# Patient Record
Sex: Female | Born: 1985 | Race: White | Hispanic: No | Smoking: Never smoker
Health system: Southern US, Community
[De-identification: ages and names within clinical notes are randomized; demographics above are authoritative.]

## PROBLEM LIST (undated history)

## (undated) DIAGNOSIS — R87629 Unspecified abnormal cytological findings in specimens from vagina: Secondary | ICD-10-CM

## (undated) DIAGNOSIS — K859 Acute pancreatitis without necrosis or infection, unspecified: Secondary | ICD-10-CM

## (undated) DIAGNOSIS — N643 Galactorrhea not associated with childbirth: Secondary | ICD-10-CM

## (undated) DIAGNOSIS — B029 Zoster without complications: Secondary | ICD-10-CM

## (undated) DIAGNOSIS — F32A Depression, unspecified: Secondary | ICD-10-CM

## (undated) DIAGNOSIS — F329 Major depressive disorder, single episode, unspecified: Secondary | ICD-10-CM

## (undated) DIAGNOSIS — F419 Anxiety disorder, unspecified: Secondary | ICD-10-CM

## (undated) DIAGNOSIS — O139 Gestational [pregnancy-induced] hypertension without significant proteinuria, unspecified trimester: Secondary | ICD-10-CM

## (undated) HISTORY — DX: Zoster without complications: B02.9

## (undated) HISTORY — DX: Gestational (pregnancy-induced) hypertension without significant proteinuria, unspecified trimester: O13.9

## (undated) HISTORY — DX: Depression, unspecified: F32.A

## (undated) HISTORY — DX: Galactorrhea not associated with childbirth: N64.3

## (undated) HISTORY — DX: Acute pancreatitis without necrosis or infection, unspecified: K85.90

## (undated) HISTORY — DX: Unspecified abnormal cytological findings in specimens from vagina: R87.629

## (undated) HISTORY — PX: LEEP: SHX91

## (undated) HISTORY — DX: Anxiety disorder, unspecified: F41.9

---
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
## 2009-06-23 ENCOUNTER — Inpatient Hospital Stay (HOSPITAL_COMMUNITY): Admission: AD | Admit: 2009-06-23 | Discharge: 2009-06-23 | Payer: Self-pay | Admitting: Obstetrics & Gynecology

## 2009-07-02 ENCOUNTER — Ambulatory Visit: Payer: Self-pay | Admitting: Obstetrics & Gynecology

## 2009-07-07 ENCOUNTER — Other Ambulatory Visit: Admission: RE | Admit: 2009-07-07 | Discharge: 2009-07-07 | Payer: Self-pay | Admitting: Obstetrics and Gynecology

## 2009-07-07 ENCOUNTER — Ambulatory Visit: Payer: Self-pay | Admitting: Obstetrics and Gynecology

## 2009-07-07 LAB — CONVERTED CEMR LAB
Basophils Absolute: 0 10*3/uL (ref 0.0–0.1)
Basophils Relative: 0 % (ref 0–1)
Eosinophils Absolute: 0.1 10*3/uL (ref 0.0–0.7)
Eosinophils Relative: 1 % (ref 0–5)
HCT: 39 % (ref 36.0–46.0)
Hepatitis B Surface Ag: NEGATIVE
Lymphocytes Relative: 21 % (ref 12–46)
Monocytes Absolute: 0.9 10*3/uL (ref 0.1–1.0)
Platelets: 354 10*3/uL (ref 150–400)
Rh Type: POSITIVE

## 2009-08-04 ENCOUNTER — Ambulatory Visit: Payer: Self-pay | Admitting: Obstetrics & Gynecology

## 2009-09-01 ENCOUNTER — Ambulatory Visit: Payer: Self-pay | Admitting: Family Medicine

## 2009-09-15 ENCOUNTER — Ambulatory Visit (HOSPITAL_COMMUNITY): Admission: RE | Admit: 2009-09-15 | Discharge: 2009-09-15 | Payer: Self-pay | Admitting: Obstetrics & Gynecology

## 2009-09-29 ENCOUNTER — Ambulatory Visit: Payer: Self-pay | Admitting: Obstetrics and Gynecology

## 2009-10-27 ENCOUNTER — Ambulatory Visit: Payer: Self-pay | Admitting: Obstetrics & Gynecology

## 2009-11-17 ENCOUNTER — Ambulatory Visit: Payer: Self-pay | Admitting: Obstetrics & Gynecology

## 2009-11-17 ENCOUNTER — Encounter: Payer: Self-pay | Admitting: Obstetrics and Gynecology

## 2009-11-17 LAB — CONVERTED CEMR LAB
HCT: 37.4 % (ref 36.0–46.0)
Hemoglobin: 12.4 g/dL (ref 12.0–15.0)
MCHC: 33.2 g/dL (ref 30.0–36.0)
MCV: 95.9 fL (ref 78.0–100.0)
RBC: 3.9 M/uL (ref 3.87–5.11)
RDW: 12.6 % (ref 11.5–15.5)
WBC: 12.8 10*3/uL — ABNORMAL HIGH (ref 4.0–10.5)

## 2009-12-08 ENCOUNTER — Ambulatory Visit: Payer: Self-pay | Admitting: Obstetrics and Gynecology

## 2009-12-29 ENCOUNTER — Ambulatory Visit: Payer: Self-pay | Admitting: Obstetrics & Gynecology

## 2010-01-12 ENCOUNTER — Ambulatory Visit: Payer: Self-pay | Admitting: Obstetrics and Gynecology

## 2010-01-19 ENCOUNTER — Ambulatory Visit: Payer: Self-pay | Admitting: Obstetrics and Gynecology

## 2010-01-19 LAB — CONVERTED CEMR LAB: GC Probe Amp, Genital: NEGATIVE

## 2010-01-20 ENCOUNTER — Encounter: Payer: Self-pay | Admitting: Obstetrics and Gynecology

## 2010-01-26 ENCOUNTER — Ambulatory Visit: Payer: Self-pay | Admitting: Obstetrics and Gynecology

## 2010-02-02 ENCOUNTER — Inpatient Hospital Stay (HOSPITAL_COMMUNITY)
Admission: AD | Admit: 2010-02-02 | Discharge: 2010-02-04 | Payer: Self-pay | Source: Home / Self Care | Attending: Obstetrics & Gynecology | Admitting: Obstetrics & Gynecology

## 2010-02-03 ENCOUNTER — Ambulatory Visit: Payer: Self-pay | Admitting: Family Medicine

## 2010-03-17 ENCOUNTER — Ambulatory Visit
Admission: RE | Admit: 2010-03-17 | Discharge: 2010-03-17 | Payer: Self-pay | Source: Home / Self Care | Attending: Obstetrics & Gynecology | Admitting: Obstetrics & Gynecology

## 2010-03-18 ENCOUNTER — Ambulatory Visit: Payer: Medicaid Other | Admitting: Obstetrics & Gynecology

## 2010-03-18 DIAGNOSIS — Z30430 Encounter for insertion of intrauterine contraceptive device: Secondary | ICD-10-CM

## 2010-03-18 NOTE — Assessment & Plan Note (Signed)
NAMEBURMA, KETCHER              ACCOUNT NO.:  [id number]  MEDICAL RECORD NO.:  [id number]          PATIENT TYPE:  POB  LOCATION:  [facility] at [facility]         FACILITY:  [facility]  PHYSICIAN:  Allie Bossier, MD        DATE OF BIRTH:  1985/05/05  DATE OF SERVICE:  03/17/2010                                 CLINIC NOTE  Cheryel is a 25 year old married white, G1, now P1, who is postpartum 6 weeks status post NSVD of her son named Roscoe.  She says that she did not have any tears with the delivery and this is confirmed with her delivery record.  She had attempted to breastfeed, but said her "milk never came in," so now she is bottle feeding.  Her breasts are now decreasing in size.  Her baby is growing well.  She denies any baby blues.  She has not had intercourse yet.  After discussion today of birth control options, she would like to have a Mirena placed and this will be scheduled for tomorrow.  She plans to go back to work in 2 weeks (she is a Industrial/product designer). She reports normal bowel and bladder function.  On exam, her perineum has no lesions or tears.  Her bimanual exam reveals a normal size and shape, involuted, retroverted uterus that is nontender.  She also has no adnexal masses or tenderness.  ASSESSMENT AND PLAN:  Postpartum, doing very well.  She will return tomorrow for her IUD placement (Mirena).     Allie Bossier, MD    MCD/MEDQ  D:  03/17/2010  T:  03/18/2010  Job:  [id number]

## 2010-04-13 ENCOUNTER — Ambulatory Visit (INDEPENDENT_AMBULATORY_CARE_PROVIDER_SITE_OTHER): Payer: Medicaid Other | Admitting: Obstetrics & Gynecology

## 2010-04-13 DIAGNOSIS — Z30431 Encounter for routine checking of intrauterine contraceptive device: Secondary | ICD-10-CM

## 2010-04-27 LAB — CBC
Hemoglobin: 12.5 g/dL (ref 12.0–15.0)
MCHC: 34.2 g/dL (ref 30.0–36.0)
WBC: 14.5 10*3/uL — ABNORMAL HIGH (ref 4.0–10.5)

## 2010-04-28 ENCOUNTER — Emergency Department: Payer: Self-pay | Admitting: Unknown Physician Specialty

## 2010-05-04 ENCOUNTER — Ambulatory Visit (INDEPENDENT_AMBULATORY_CARE_PROVIDER_SITE_OTHER): Payer: Medicaid Other | Admitting: Obstetrics & Gynecology

## 2010-05-04 DIAGNOSIS — N949 Unspecified condition associated with female genital organs and menstrual cycle: Secondary | ICD-10-CM

## 2010-05-05 LAB — CBC
HCT: 38.8 % (ref 36.0–46.0)
Hemoglobin: 13.4 g/dL (ref 12.0–15.0)
MCHC: 34.7 g/dL (ref 30.0–36.0)
MCV: 96.5 fL (ref 78.0–100.0)
RBC: 4.01 MIL/uL (ref 3.87–5.11)

## 2010-05-05 LAB — URINALYSIS, ROUTINE W REFLEX MICROSCOPIC
Glucose, UA: NEGATIVE mg/dL
Ketones, ur: NEGATIVE mg/dL
Nitrite: NEGATIVE
Protein, ur: NEGATIVE mg/dL
Urobilinogen, UA: 0.2 mg/dL (ref 0.0–1.0)

## 2010-05-05 LAB — GC/CHLAMYDIA PROBE AMP, GENITAL
Chlamydia, DNA Probe: NEGATIVE
GC Probe Amp, Genital: NEGATIVE

## 2010-05-05 LAB — ABO/RH: ABO/RH(D): B POS

## 2010-05-05 LAB — WET PREP, GENITAL: Yeast Wet Prep HPF POC: NONE SEEN

## 2010-05-05 LAB — POCT PREGNANCY, URINE: Preg Test, Ur: POSITIVE

## 2010-05-08 NOTE — Assessment & Plan Note (Signed)
NAMEEVERLIE, EBLE NO.:  [id number]  MEDICAL RECORD NO.:  [id number]           PATIENT TYPE:  LOCATION:  [facility] at [facility]           FACILITY:  PHYSICIAN:  Scheryl Darter, MD       DATE OF BIRTH:  02-20-1985  DATE OF SERVICE:                                 CLINIC NOTE  Patient comes today for Mirena IUD insertion.  She has reviewed the literature.  She understands the nature of the birth control method and the procedure for insertion.  The risks of bleeding, infection, uterine perforation and pain were discussed.  Questions were answered.  She signed consent for insertion.  PHYSICAL EXAMINATION:  GENERAL:  The patient in no acute distress. VITAL SIGNS:  Weight is 140 pounds, blood pressure 111/65, pulse 91. PELVIC:  External genitalia, vagina and cervix appeared normal.  Cervix was prepped with Betadine, this also was grasped with single- tooth tenaculum.  Uterus sounded to 9 cm.  The Mirena IUD was placed in usual fashion with no problems.  String was trimmed to about 3 cm.  All instruments removed.  The patient tolerated this well.  She was given instructions to report excessive bleeding, pain, abnormal discharge, or fever.  Recommend she return in 4 weeks to check placement of the IUD.     Scheryl Darter, MD    JA/MEDQ  D:  03/18/2010  T:  03/19/2010  Job:  [id number]

## 2010-05-08 NOTE — Assessment & Plan Note (Unsigned)
Maria Pugh, COMMON NO.:  [id number]  MEDICAL RECORD NO.:  [id number]           PATIENT TYPE:  LOCATION:  [facility] at [facility]           FACILITY:  PHYSICIAN:  Argentina Donovan, MD        DATE OF BIRTH:  [birth year]  DATE OF SERVICE:                                 CLINIC NOTE  The patient is a 24 year old white female, gravida 1, para 1-0-0-1, who had a baby in December 2011, had an IUD placed in early February, and was seen here at the end of February because she was still having some irregular bleeding.  She was placed on birth control pills for few months to control that, and the string was shortened.  Over this weekend, she went into emergency room because of the abrupt onset of left lower quadrant pain just below her umbilicus on the left following sexual intercourse.  They did an ultrasound which was completely normal, and they ran a battery of tests which were normal.  The only thing that was seen in the abdomen was a slight amount of fluid which was probably physiologic.  When she came in today, I checked her and the uterus was in normal size, shape, consistency.  Anterior adnexa normal, and the patient is not experiencing any pain at this time.  We have talked about the fact that positional changes can occur with pregnancy, and that she is probably hitting the cervix and having resultant shock waves to the ovaries, which have given her the radiating pain up towards her umbilicus when she has sex.  We have talked about changing positions during sex to avoid that and trying to educate her in the superior position, doggie, etc.  She is going to try that.  I think that she needs reassurance that this was not a serious problem and she could not do damage to herself, I think that is what is bothering her the most. She seemed happy with the discussion and was discharged in satisfactory condition.          ______________________________ Argentina Donovan,  MD    PR/MEDQ  D:  05/04/2010  T:  05/05/2010  Job:  [id number]

## 2010-05-21 NOTE — H&P (Signed)
[name], Maria Pugh NO.:  [id number]  MEDICAL RECORD NO.:  [id number]           PATIENT TYPE:  [facility]ATION[facility]  [facility]                     FACILITY:  PHYSICIAN:  Scheryl Darter, MD       DATE OF BIRTH:  [birth year]  DATE OF SERVICE:                          PRE-OP HISTORY & PHYSICAL  The patient comes for followup after her endometrial biopsy and Pap smear.  The patient is a   Dictation ended at this point.     Scheryl Darter, MD    JA/MEDQ  D:  03/18/2010  T:  03/19/2010  Job:  [id number]

## 2012-01-04 ENCOUNTER — Telehealth: Payer: Self-pay

## 2012-01-04 NOTE — Telephone Encounter (Signed)
Patient called regarding her birth control. She does not have insurance at the time and was recently giving a pap with the free pap smear clinic. She now has a problem and needs birth control and the clinic could not give her a script because they only do pap's an no priscriptions. She called [place] to see if our physicians can call her in some birth control she will be bringing the confirmation letter of her last pap. Dr. Macon Large agreed to give her some birth control temporarily till she gets in for her next annual and has insurance.

## 2012-01-17 ENCOUNTER — Telehealth: Payer: Self-pay

## 2012-01-17 NOTE — Telephone Encounter (Signed)
Patient had a free pap smear done and now needs birth control called in. Per Dr.Anyanwu we called in Sprintec to her [facility] on [address] with 1 year refills.

## 2012-04-11 ENCOUNTER — Encounter: Payer: Self-pay | Admitting: Family Medicine

## 2012-04-11 ENCOUNTER — Ambulatory Visit (INDEPENDENT_AMBULATORY_CARE_PROVIDER_SITE_OTHER): Payer: 59 | Admitting: Family Medicine

## 2012-04-11 VITALS — BP 112/75 | HR 79 | Ht 62.0 in | Wt 133.4 lb

## 2012-04-11 DIAGNOSIS — Z124 Encounter for screening for malignant neoplasm of cervix: Secondary | ICD-10-CM

## 2012-04-11 DIAGNOSIS — Z01419 Encounter for gynecological examination (general) (routine) without abnormal findings: Secondary | ICD-10-CM

## 2012-04-11 DIAGNOSIS — Z113 Encounter for screening for infections with a predominantly sexual mode of transmission: Secondary | ICD-10-CM

## 2012-04-11 DIAGNOSIS — Z3009 Encounter for other general counseling and advice on contraception: Secondary | ICD-10-CM

## 2012-04-11 MED ORDER — NORGESTIMATE-ETH ESTRADIOL 0.25-35 MG-MCG PO TABS: 1.0000 | ORAL_TABLET | Freq: Every day | ORAL | Status: DC

## 2012-04-11 NOTE — Progress Notes (Signed)
  Subjective:     Maria Pugh is a 27 y.o. female and is here for a comprehensive physical exam. The patient reports no problems.  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  History   Social History  . Marital Status: Married    Spouse Name: N/A    Number of Children: N/A  . Years of Education: N/A   Occupational History  . Not on file.   Social History Main Topics  . Smoking status: Never Smoker   . Smokeless tobacco: Not on file  . Alcohol Use: 0.6 oz/week    1 Glasses of wine per week  . Drug Use: No  . Sexually Active: Yes    Birth Control/ Protection: Pill   Other Topics Concern  . Not on file   Social History Narrative  . No narrative on file   Health Maintenance  Topic Date Due  . Influenza Vaccine  10/17/1987  . Pap Smear  06/09/2005  . Tetanus/tdap  06/10/2006    The following portions of the patient's history were reviewed and updated as appropriate: allergies, current medications, past family history, past medical history, past social history, past surgical history and problem list.  Review of Systems A comprehensive review of systems was negative.   Objective:    BP 112/75  Pulse 79  Ht 5\' 2"  (1.575 m)  Wt 133 lb 6 oz (60.499 kg)  BMI 24.39 kg/m2  LMP 02/20/2012 General appearance: alert, cooperative and appears stated age Head: Normocephalic, without obvious abnormality, atraumatic Neck: no adenopathy, supple, symmetrical, trachea midline and thyroid not enlarged, symmetric, no tenderness/mass/nodules Lungs: clear to auscultation bilaterally Breasts: normal appearance, no masses or tenderness Heart: regular rate and rhythm, S1, S2 normal, no murmur, click, rub or gallop Abdomen: soft, non-tender; bowel sounds normal; no masses,  no organomegaly Pelvic: cervix normal in appearance, external genitalia normal, no adnexal masses or tenderness, no cervical motion tenderness, uterus normal  size, shape, and consistency and vagina normal without discharge Extremities: extremities normal, atraumatic, no cyanosis or edema Pulses: 2+ and symmetric Skin: Skin color, texture, turgor normal. No rashes or lesions Lymph nodes: Cervical, supraclavicular, and axillary nodes normal. Neurologic: Grossly normal    Assessment:    Healthy female exam. Contraceptive Counseling      Plan:    Refilled OC's Pap today Declines Gardasil. See After Visit Summary for Counseling Recommendations

## 2012-04-11 NOTE — Progress Notes (Signed)
Here today for yearly gyn exam, no complaints. Periods are still not regular since switching to the pill from the Mirena. Had pap smear in October 2013 thru free pap smear clinic.

## 2012-04-11 NOTE — Patient Instructions (Signed)
Preventive Care for Adults, Female A healthy lifestyle and preventive care can promote health and wellness. Preventive health guidelines for women include the following key practices.  A routine yearly physical is a good way to check with your caregiver about your health and preventive screening. It is a chance to share any concerns and updates on your health, and to receive a thorough exam.  Visit your dentist for a routine exam and preventive care every 6 months. Brush your teeth twice a day and floss once a day. Good oral hygiene prevents tooth decay and gum disease.  The frequency of eye exams is based on your age, health, family medical history, use of contact lenses, and other factors. Follow your caregiver's recommendations for frequency of eye exams.  Eat a healthy diet. Foods like vegetables, fruits, whole grains, low-fat dairy products, and lean protein foods contain the nutrients you need without too many calories. Decrease your intake of foods high in solid fats, added sugars, and salt. Eat the right amount of calories for you.Get information about a proper diet from your caregiver, if necessary.  Regular physical exercise is one of the most important things you can do for your health. Most adults should get at least 150 minutes of moderate-intensity exercise (any activity that increases your heart rate and causes you to sweat) each week. In addition, most adults need muscle-strengthening exercises on 2 or more days a week.  Maintain a healthy weight. The body mass index (BMI) is a screening tool to identify possible weight problems. It provides an estimate of body fat based on height and weight. Your caregiver can help determine your BMI, and can help you achieve or maintain a healthy weight.For adults 20 years and older:  A BMI below 18.5 is considered underweight.  A BMI of 18.5 to 24.9 is normal.  A BMI of 25 to 29.9 is considered overweight.  A BMI of 30 and above is  considered obese.  Maintain normal blood lipids and cholesterol levels by exercising and minimizing your intake of saturated fat. Eat a balanced diet with plenty of fruit and vegetables. Blood tests for lipids and cholesterol should begin at age 20 and be repeated every 5 years. If your lipid or cholesterol levels are high, you are over 50, or you are at high risk for heart disease, you may need your cholesterol levels checked more frequently.Ongoing high lipid and cholesterol levels should be treated with medicines if diet and exercise are not effective.  If you smoke, find out from your caregiver how to quit. If you do not use tobacco, do not start.  If you are pregnant, do not drink alcohol. If you are breastfeeding, be very cautious about drinking alcohol. If you are not pregnant and choose to drink alcohol, do not exceed 1 drink per day. One drink is considered to be 12 ounces (355 mL) of beer, 5 ounces (148 mL) of wine, or 1.5 ounces (44 mL) of liquor.  Avoid use of street drugs. Do not share needles with anyone. Ask for help if you need support or instructions about stopping the use of drugs.  High blood pressure causes heart disease and increases the risk of stroke. Your blood pressure should be checked at least every 1 to 2 years. Ongoing high blood pressure should be treated with medicines if weight loss and exercise are not effective.  If you are 55 to 25 years old, ask your caregiver if you should take aspirin to prevent strokes.  Diabetes   screening involves taking a blood sample to check your fasting blood sugar level. This should be done once every 3 years, after age 45, if you are within normal weight and without risk factors for diabetes. Testing should be considered at a younger age or be carried out more frequently if you are overweight and have at least 1 risk factor for diabetes.  Breast cancer screening is essential preventive care for women. You should practice "breast  self-awareness." This means understanding the normal appearance and feel of your breasts and may include breast self-examination. Any changes detected, no matter how small, should be reported to a caregiver. Women in their 20s and 30s should have a clinical breast exam (CBE) by a caregiver as part of a regular health exam every 1 to 3 years. After age 40, women should have a CBE every year. Starting at age 40, women should consider having a mammography (breast X-ray test) every year. Women who have a family history of breast cancer should talk to their caregiver about genetic screening. Women at a high risk of breast cancer should talk to their caregivers about having magnetic resonance imaging (MRI) and a mammography every year.  The Pap test is a screening test for cervical cancer. A Pap test can show cell changes on the cervix that might become cervical cancer if left untreated. A Pap test is a procedure in which cells are obtained and examined from the lower end of the uterus (cervix).  Women should have a Pap test starting at age 21.  Between ages 21 and 29, Pap tests should be repeated every 2 years.  Beginning at age 30, you should have a Pap test every 3 years as long as the past 3 Pap tests have been normal.  Some women have medical problems that increase the chance of getting cervical cancer. Talk to your caregiver about these problems. It is especially important to talk to your caregiver if a new problem develops soon after your last Pap test. In these cases, your caregiver may recommend more frequent screening and Pap tests.  The above recommendations are the same for women who have or have not gotten the vaccine for human papillomavirus (HPV).  If you had a hysterectomy for a problem that was not cancer or a condition that could lead to cancer, then you no longer need Pap tests. Even if you no longer need a Pap test, a regular exam is a good idea to make sure no other problems are  starting.  If you are between ages 65 and 70, and you have had normal Pap tests going back 10 years, you no longer need Pap tests. Even if you no longer need a Pap test, a regular exam is a good idea to make sure no other problems are starting.  If you have had past treatment for cervical cancer or a condition that could lead to cancer, you need Pap tests and screening for cancer for at least 20 years after your treatment.  If Pap tests have been discontinued, risk factors (such as a new sexual partner) need to be reassessed to determine if screening should be resumed.  The HPV test is an additional test that may be used for cervical cancer screening. The HPV test looks for the virus that can cause the cell changes on the cervix. The cells collected during the Pap test can be tested for HPV. The HPV test could be used to screen women aged 30 years and older, and should   be used in women of any age who have unclear Pap test results. After the age of 30, women should have HPV testing at the same frequency as a Pap test.  Colorectal cancer can be detected and often prevented. Most routine colorectal cancer screening begins at the age of 50 and continues through age 75. However, your caregiver may recommend screening at an earlier age if you have risk factors for colon cancer. On a yearly basis, your caregiver may provide home test kits to check for hidden blood in the stool. Use of a small camera at the end of a tube, to directly examine the colon (sigmoidoscopy or colonoscopy), can detect the earliest forms of colorectal cancer. Talk to your caregiver about this at age 50, when routine screening begins. Direct examination of the colon should be repeated every 5 to 10 years through age 75, unless early forms of pre-cancerous polyps or small growths are found.  Hepatitis C blood testing is recommended for all people born from 1945 through 1965 and any individual with known risks for hepatitis C.  Practice  safe sex. Use condoms and avoid high-risk sexual practices to reduce the spread of sexually transmitted infections (STIs). STIs include gonorrhea, chlamydia, syphilis, trichomonas, herpes, HPV, and human immunodeficiency virus (HIV). Herpes, HIV, and HPV are viral illnesses that have no cure. They can result in disability, cancer, and death. Sexually active women aged 25 and younger should be checked for chlamydia. Older women with new or multiple partners should also be tested for chlamydia. Testing for other STIs is recommended if you are sexually active and at increased risk.  Osteoporosis is a disease in which the bones lose minerals and strength with aging. This can result in serious bone fractures. The risk of osteoporosis can be identified using a bone density scan. Women ages 65 and over and women at risk for fractures or osteoporosis should discuss screening with their caregivers. Ask your caregiver whether you should take a calcium supplement or vitamin D to reduce the rate of osteoporosis.  Menopause can be associated with physical symptoms and risks. Hormone replacement therapy is available to decrease symptoms and risks. You should talk to your caregiver about whether hormone replacement therapy is right for you.  Use sunscreen with sun protection factor (SPF) of 30 or more. Apply sunscreen liberally and repeatedly throughout the day. You should seek shade when your shadow is shorter than you. Protect yourself by wearing long sleeves, pants, a wide-brimmed hat, and sunglasses year round, whenever you are outdoors.  Once a month, do a whole body skin exam, using a mirror to look at the skin on your back. Notify your caregiver of new moles, moles that have irregular borders, moles that are larger than a pencil eraser, or moles that have changed in shape or color.  Stay current with required immunizations.  Influenza. You need a dose every fall (or winter). The composition of the flu vaccine  changes each year, so being vaccinated once is not enough.  Pneumococcal polysaccharide. You need 1 to 2 doses if you smoke cigarettes or if you have certain chronic medical conditions. You need 1 dose at age 65 (or older) if you have never been vaccinated.  Tetanus, diphtheria, pertussis (Tdap, Td). Get 1 dose of Tdap vaccine if you are younger than age 65, are over 65 and have contact with an infant, are a healthcare worker, are pregnant, or simply want to be protected from whooping cough. After that, you need a Td   booster dose every 10 years. Consult your caregiver if you have not had at least 3 tetanus and diphtheria-containing shots sometime in your life or have a deep or dirty wound.  HPV. You need this vaccine if you are a woman age 26 or younger. The vaccine is given in 3 doses over 6 months.  Measles, mumps, rubella (MMR). You need at least 1 dose of MMR if you were born in 1957 or later. You may also need a second dose.  Meningococcal. If you are age 19 to 21 and a first-year college student living in a residence hall, or have one of several medical conditions, you need to get vaccinated against meningococcal disease. You may also need additional booster doses.  Zoster (shingles). If you are age 60 or older, you should get this vaccine.  Varicella (chickenpox). If you have never had chickenpox or you were vaccinated but received only 1 dose, talk to your caregiver to find out if you need this vaccine.  Hepatitis A. You need this vaccine if you have a specific risk factor for hepatitis A virus infection or you simply wish to be protected from this disease. The vaccine is usually given as 2 doses, 6 to 18 months apart.  Hepatitis B. You need this vaccine if you have a specific risk factor for hepatitis B virus infection or you simply wish to be protected from this disease. The vaccine is given in 3 doses, usually over 6 months. Preventive Services / Frequency Ages 19 to 39  Blood  pressure check.** / Every 1 to 2 years.  Lipid and cholesterol check.** / Every 5 years beginning at age 20.  Clinical breast exam.** / Every 3 years for women in their 20s and 30s.  Pap test.** / Every 2 years from ages 21 through 29. Every 3 years starting at age 30 through age 65 or 70 with a history of 3 consecutive normal Pap tests.  HPV screening.** / Every 3 years from ages 30 through ages 65 to 70 with a history of 3 consecutive normal Pap tests.  Hepatitis C blood test.** / For any individual with known risks for hepatitis C.  Skin self-exam. / Monthly.  Influenza immunization.** / Every year.  Pneumococcal polysaccharide immunization.** / 1 to 2 doses if you smoke cigarettes or if you have certain chronic medical conditions.  Tetanus, diphtheria, pertussis (Tdap, Td) immunization. / A one-time dose of Tdap vaccine. After that, you need a Td booster dose every 10 years.  HPV immunization. / 3 doses over 6 months, if you are 26 and younger.  Measles, mumps, rubella (MMR) immunization. / You need at least 1 dose of MMR if you were born in 1957 or later. You may also need a second dose.  Meningococcal immunization. / 1 dose if you are age 19 to 21 and a first-year college student living in a residence hall, or have one of several medical conditions, you need to get vaccinated against meningococcal disease. You may also need additional booster doses.  Varicella immunization.** / Consult your caregiver.  Hepatitis A immunization.** / Consult your caregiver. 2 doses, 6 to 18 months apart.  Hepatitis B immunization.** / Consult your caregiver. 3 doses usually over 6 months. Ages 40 to 64  Blood pressure check.** / Every 1 to 2 years.  Lipid and cholesterol check.** / Every 5 years beginning at age 20.  Clinical breast exam.** / Every year after age 40.  Mammogram.** / Every year beginning at age 40   and continuing for as long as you are in good health. Consult with your  caregiver.  Pap test.** / Every 3 years starting at age 30 through age 65 or 70 with a history of 3 consecutive normal Pap tests.  HPV screening.** / Every 3 years from ages 30 through ages 65 to 70 with a history of 3 consecutive normal Pap tests.  Fecal occult blood test (FOBT) of stool. / Every year beginning at age 50 and continuing until age 75. You may not need to do this test if you get a colonoscopy every 10 years.  Flexible sigmoidoscopy or colonoscopy.** / Every 5 years for a flexible sigmoidoscopy or every 10 years for a colonoscopy beginning at age 50 and continuing until age 75.  Hepatitis C blood test.** / For all people born from 1945 through 1965 and any individual with known risks for hepatitis C.  Skin self-exam. / Monthly.  Influenza immunization.** / Every year.  Pneumococcal polysaccharide immunization.** / 1 to 2 doses if you smoke cigarettes or if you have certain chronic medical conditions.  Tetanus, diphtheria, pertussis (Tdap, Td) immunization.** / A one-time dose of Tdap vaccine. After that, you need a Td booster dose every 10 years.  Measles, mumps, rubella (MMR) immunization. / You need at least 1 dose of MMR if you were born in 1957 or later. You may also need a second dose.  Varicella immunization.** / Consult your caregiver.  Meningococcal immunization.** / Consult your caregiver.  Hepatitis A immunization.** / Consult your caregiver. 2 doses, 6 to 18 months apart.  Hepatitis B immunization.** / Consult your caregiver. 3 doses, usually over 6 months. Ages 65 and over  Blood pressure check.** / Every 1 to 2 years.  Lipid and cholesterol check.** / Every 5 years beginning at age 20.  Clinical breast exam.** / Every year after age 40.  Mammogram.** / Every year beginning at age 40 and continuing for as long as you are in good health. Consult with your caregiver.  Pap test.** / Every 3 years starting at age 30 through age 65 or 70 with a 3  consecutive normal Pap tests. Testing can be stopped between 65 and 70 with 3 consecutive normal Pap tests and no abnormal Pap or HPV tests in the past 10 years.  HPV screening.** / Every 3 years from ages 30 through ages 65 or 70 with a history of 3 consecutive normal Pap tests. Testing can be stopped between 65 and 70 with 3 consecutive normal Pap tests and no abnormal Pap or HPV tests in the past 10 years.  Fecal occult blood test (FOBT) of stool. / Every year beginning at age 50 and continuing until age 75. You may not need to do this test if you get a colonoscopy every 10 years.  Flexible sigmoidoscopy or colonoscopy.** / Every 5 years for a flexible sigmoidoscopy or every 10 years for a colonoscopy beginning at age 50 and continuing until age 75.  Hepatitis C blood test.** / For all people born from 1945 through 1965 and any individual with known risks for hepatitis C.  Osteoporosis screening.** / A one-time screening for women ages 65 and over and women at risk for fractures or osteoporosis.  Skin self-exam. / Monthly.  Influenza immunization.** / Every year.  Pneumococcal polysaccharide immunization.** / 1 dose at age 65 (or older) if you have never been vaccinated.  Tetanus, diphtheria, pertussis (Tdap, Td) immunization. / A one-time dose of Tdap vaccine if you are over   65 and have contact with an infant, are a healthcare worker, or simply want to be protected from whooping cough. After that, you need a Td booster dose every 10 years.  Varicella immunization.** / Consult your caregiver.  Meningococcal immunization.** / Consult your caregiver.  Hepatitis A immunization.** / Consult your caregiver. 2 doses, 6 to 18 months apart.  Hepatitis B immunization.** / Check with your caregiver. 3 doses, usually over 6 months. ** Family history and personal history of risk and conditions may change your caregiver's recommendations. Document Released: 03/30/2001 Document Revised: 04/26/2011  Document Reviewed: 06/29/2010 [employer] Patient Information 2013 [employer].  

## 2012-04-12 LAB — LIPID PANEL
Cholesterol: 178 mg/dL (ref 0–200)
HDL: 53 mg/dL (ref >39)
Triglycerides: 69 mg/dL (ref <150)

## 2012-04-12 LAB — CBC
HCT: 38.6 % (ref 36.0–46.0)
Hemoglobin: 12.9 g/dL (ref 12.0–15.0)
MCH: 31.4 pg (ref 26.0–34.0)
MCHC: 33.4 g/dL (ref 30.0–36.0)
RDW: 13 % (ref 11.5–15.5)
WBC: 6.2 10*3/uL (ref 4.0–10.5)

## 2012-04-12 LAB — COMPREHENSIVE METABOLIC PANEL
AST: 16 U/L (ref 0–37)
Albumin: 4 g/dL (ref 3.5–5.2)
Alkaline Phosphatase: 47 U/L (ref 39–117)
CO2: 27 mEq/L (ref 19–32)
Potassium: 3.8 mEq/L (ref 3.5–5.3)
Sodium: 139 mEq/L (ref 135–145)
Total Protein: 6.5 g/dL (ref 6.0–8.3)

## 2012-12-21 ENCOUNTER — Other Ambulatory Visit: Payer: Self-pay

## 2013-05-21 ENCOUNTER — Other Ambulatory Visit: Payer: Self-pay | Admitting: *Deleted

## 2013-05-21 DIAGNOSIS — Z3009 Encounter for other general counseling and advice on contraception: Secondary | ICD-10-CM

## 2013-05-21 MED ORDER — NORGESTIMATE-ETH ESTRADIOL 0.25-35 MG-MCG PO TABS: 1.0000 | ORAL_TABLET | Freq: Every day | ORAL | Status: DC

## 2013-05-21 NOTE — Telephone Encounter (Signed)
Pt needs refills on her birth control pills.  She is out of insurance at the moment but is trying to go to a free clinic to get a pap smear.  If she can, she will send us the results so we can continue to fill her medicine.  If not, she understands she will not be able to continue to receive birth control from [facility] until an annual is done.

## 2013-07-19 ENCOUNTER — Encounter: Payer: Self-pay | Admitting: Obstetrics & Gynecology

## 2013-07-19 ENCOUNTER — Ambulatory Visit (INDEPENDENT_AMBULATORY_CARE_PROVIDER_SITE_OTHER): Payer: Self-pay | Admitting: Obstetrics & Gynecology

## 2013-07-19 VITALS — BP 107/75 | HR 66 | Ht 62.0 in | Wt 145.6 lb

## 2013-07-19 DIAGNOSIS — Z3009 Encounter for other general counseling and advice on contraception: Secondary | ICD-10-CM

## 2013-07-19 DIAGNOSIS — Z01419 Encounter for gynecological examination (general) (routine) without abnormal findings: Secondary | ICD-10-CM

## 2013-07-19 DIAGNOSIS — Z0000 Encounter for general adult medical examination without abnormal findings: Secondary | ICD-10-CM

## 2013-07-19 MED ORDER — NORGESTIMATE-ETH ESTRADIOL 0.25-35 MG-MCG PO TABS: 1.0000 | ORAL_TABLET | Freq: Every day | ORAL | Status: DC

## 2013-07-19 NOTE — Progress Notes (Signed)
Subjective:    Maria Pugh is a 28 y.o. female who presents for an annual exam. The patient has no complaints today. She needs a refill of her OCPs. The patient is sexually active. GYN screening history: last pap: was normal. The patient wears seatbelts: yes. The patient participates in regular exercise: yes. Has the patient ever been transfused or tattooed?: yes. (tattoo)  The patient reports that there is not domestic violence in her life.   Menstrual History: OB History   Grav Para Term Preterm Abortions TAB SAB Ect Mult Living   1 1 1  0 0 0 0 0 0 1      Menarche age: 63  Patient's last menstrual period was 06/19/2013.    The following portions of the patient's history were reviewed and updated as appropriate: allergies, current medications, past family history, past medical history, past social history, past surgical history and problem list.  Review of Systems A comprehensive review of systems was negative. Married for 6 years. 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 She bottlefed.   Objective:    BP 107/75  Pulse 66  Ht 5\' 2"  (1.575 m)  Wt 145 lb 9.6 oz (66.044 kg)  BMI 26.62 kg/m2  LMP 06/19/2013  General Appearance:    Alert, cooperative, no distress, appears stated age  Head:    Normocephalic, without obvious abnormality, atraumatic  Eyes:    PERRL, conjunctiva/corneas clear, EOM's intact, fundi    benign, both eyes  Ears:    Normal TM's and external ear canals, both ears  Nose:   Nares normal, septum midline, mucosa normal, no drainage    or sinus tenderness  Throat:   Lips, mucosa, and tongue normal; teeth and gums normal  Neck:   Supple, symmetrical, trachea midline, no adenopathy;    thyroid:  no enlargement/tenderness/nodules; no carotid   bruit or JVD  Back:     Symmetric, no curvature, ROM normal, no CVA tenderness  Lungs:     Clear to auscultation bilaterally, respirations unlabored  Chest Wall:    No tenderness or deformity   Heart:    Regular rate and rhythm, S1 and S2  normal, no murmur, rub   or gallop  Breast Exam:    No tenderness, masses, or nipple abnormality  Abdomen:     Soft, non-tender, bowel sounds active all four quadrants,    no masses, no organomegaly  Genitalia:    Normal female without lesion, discharge or tenderness, NSSA, NT, normal adnexa     Extremities:   Extremities normal, atraumatic, no cyanosis or edema  Pulses:   2+ and symmetric all extremities  Skin:   Skin color, texture, turgor normal, no rashes or lesions  Lymph nodes:   Cervical, supraclavicular, and axillary nodes normal  Neurologic:   CNII-XII intact, normal strength, sensation and reflexes    throughout  .    Assessment:    Healthy female exam.    Plan:     Rec SBEs Refill of OCPs

## 2013-12-17 ENCOUNTER — Encounter: Payer: Self-pay | Admitting: Obstetrics & Gynecology

## 2014-07-17 HISTORY — PX: MOUTH SURGERY: SHX715

## 2014-08-07 ENCOUNTER — Encounter: Payer: Self-pay | Admitting: Certified Nurse Midwife

## 2014-08-07 ENCOUNTER — Ambulatory Visit (INDEPENDENT_AMBULATORY_CARE_PROVIDER_SITE_OTHER): Payer: Medicaid Other | Admitting: Advanced Practice Midwife

## 2014-08-07 VITALS — BP 117/74 | HR 81 | Wt 140.0 lb

## 2014-08-07 DIAGNOSIS — Z3490 Encounter for supervision of normal pregnancy, unspecified, unspecified trimester: Secondary | ICD-10-CM

## 2014-08-07 DIAGNOSIS — Z3481 Encounter for supervision of other normal pregnancy, first trimester: Secondary | ICD-10-CM | POA: Diagnosis not present

## 2014-08-07 DIAGNOSIS — Z113 Encounter for screening for infections with a predominantly sexual mode of transmission: Secondary | ICD-10-CM | POA: Diagnosis not present

## 2014-08-07 DIAGNOSIS — Z124 Encounter for screening for malignant neoplasm of cervix: Secondary | ICD-10-CM | POA: Diagnosis not present

## 2014-08-07 DIAGNOSIS — Z1151 Encounter for screening for human papillomavirus (HPV): Secondary | ICD-10-CM

## 2014-08-07 DIAGNOSIS — Z8742 Personal history of other diseases of the female genital tract: Secondary | ICD-10-CM

## 2014-08-07 NOTE — Progress Notes (Signed)
Pt was on abx 07/03/14 for two weeks. Negative UPT 07/15/14. Positive UPT 08/01/14. Had oral surgery 07/17/14. Stopped OCP 08/01/14 after positive pregnancy test. Currently c/o abd cramping.  Last PAP 03/2012 - Normal

## 2014-08-07 NOTE — Patient Instructions (Signed)
First Trimester of Pregnancy The first trimester of pregnancy is from week 1 until the end of week 12 (months 1 through 3). A week after a sperm fertilizes an egg, the egg will implant on the wall of the uterus. This embryo will begin to develop into a baby. Genes from you and your partner are forming the baby. The female genes determine whether the baby is a boy or a girl. At 6-8 weeks, the eyes and face are formed, and the heartbeat can be seen on ultrasound. At the end of 12 weeks, all the baby's organs are formed.  Now that you are pregnant, you will want to do everything you can to have a healthy baby. Two of the most important things are to get good prenatal care and to follow your health care provider's instructions. Prenatal care is all the medical care you receive before the baby's birth. This care will help prevent, find, and treat any problems during the pregnancy and childbirth. BODY CHANGES Your body goes through many changes during pregnancy. The changes vary from woman to woman.   You may gain or lose a couple of pounds at first.  You may feel sick to your stomach (nauseous) and throw up (vomit). If the vomiting is uncontrollable, call your health care provider.  You may tire easily.  You may develop headaches that can be relieved by medicines approved by your health care provider.  You may urinate more often. Painful urination may mean you have a bladder infection.  You may develop heartburn as a result of your pregnancy.  You may develop constipation because certain hormones are causing the muscles that push waste through your intestines to slow down.  You may develop hemorrhoids or swollen, bulging veins (varicose veins).  Your breasts may begin to grow larger and become tender. Your nipples may stick out more, and the tissue that surrounds them (areola) may become darker.  Your gums may bleed and may be sensitive to brushing and flossing.  Dark spots or blotches (chloasma,  mask of pregnancy) may develop on your face. This will likely fade after the baby is born.  Your menstrual periods will stop.  You may have a loss of appetite.  You may develop cravings for certain kinds of food.  You may have changes in your emotions from day to day, such as being excited to be pregnant or being concerned that something may go wrong with the pregnancy and baby.  You may have more vivid and strange dreams.  You may have changes in your hair. These can include thickening of your hair, rapid growth, and changes in texture. Some women also have hair loss during or after pregnancy, or hair that feels dry or thin. Your hair will most likely return to normal after your baby is born. WHAT TO EXPECT AT YOUR PRENATAL VISITS During a routine prenatal visit:  You will be weighed to make sure you and the baby are growing normally.  Your blood pressure will be taken.  Your abdomen will be measured to track your baby's growth.  The fetal heartbeat will be listened to starting around week 10 or 12 of your pregnancy.  Test results from any previous visits will be discussed. Your health care provider may ask you:  How you are feeling.  If you are feeling the baby move.  If you have had any abnormal symptoms, such as leaking fluid, bleeding, severe headaches, or abdominal cramping.  If you have any questions. Other tests   that may be performed during your first trimester include:  Blood tests to find your blood type and to check for the presence of any previous infections. They will also be used to check for low iron levels (anemia) and Rh antibodies. Later in the pregnancy, blood tests for diabetes will be done along with other tests if problems develop.  Urine tests to check for infections, diabetes, or protein in the urine.  An ultrasound to confirm the proper growth and development of the baby.  An amniocentesis to check for possible genetic problems.  Fetal screens for  spina bifida and Down syndrome.  You may need other tests to make sure you and the baby are doing well. HOME CARE INSTRUCTIONS  Medicines  Follow your health care provider's instructions regarding medicine use. Specific medicines may be either safe or unsafe to take during pregnancy.  Take your prenatal vitamins as directed.  If you develop constipation, try taking a stool softener if your health care provider approves. Diet  Eat regular, well-balanced meals. Choose a variety of foods, such as meat or vegetable-based protein, fish, milk and low-fat dairy products, vegetables, fruits, and whole grain breads and cereals. Your health care provider will help you determine the amount of weight gain that is right for you.  Avoid raw meat and uncooked cheese. These carry germs that can cause birth defects in the baby.  Eating four or five small meals rather than three large meals a day may help relieve nausea and vomiting. If you start to feel nauseous, eating a few soda crackers can be helpful. Drinking liquids between meals instead of during meals also seems to help nausea and vomiting.  If you develop constipation, eat more high-fiber foods, such as fresh vegetables or fruit and whole grains. Drink enough fluids to keep your urine clear or pale yellow. Activity and Exercise  Exercise only as directed by your health care provider. Exercising will help you:  Control your weight.  Stay in shape.  Be prepared for labor and delivery.  Experiencing pain or cramping in the lower abdomen or low back is a good sign that you should stop exercising. Check with your health care provider before continuing normal exercises.  Try to avoid standing for long periods of time. Move your legs often if you must stand in one place for a long time.  Avoid heavy lifting.  Wear low-heeled shoes, and practice good posture.  You may continue to have sex unless your health care provider directs you  otherwise. Relief of Pain or Discomfort  Wear a good support bra for breast tenderness.   Take warm sitz baths to soothe any pain or discomfort caused by hemorrhoids. Use hemorrhoid cream if your health care provider approves.   Rest with your legs elevated if you have leg cramps or low back pain.  If you develop varicose veins in your legs, wear support hose. Elevate your feet for 15 minutes, 3-4 times a day. Limit salt in your diet. Prenatal Care  Schedule your prenatal visits by the twelfth week of pregnancy. They are usually scheduled monthly at first, then more often in the last 2 months before delivery.  Write down your questions. Take them to your prenatal visits.  Keep all your prenatal visits as directed by your health care provider. Safety  Wear your seat belt at all times when driving.  Make a list of emergency phone numbers, including numbers for family, friends, the hospital, and police and fire departments. General Tips    Ask your health care provider for a referral to a local prenatal education class. Begin classes no later than at the beginning of month 6 of your pregnancy.  Ask for help if you have counseling or nutritional needs during pregnancy. Your health care provider can offer advice or refer you to specialists for help with various needs.  Do not use hot tubs, steam rooms, or saunas.  Do not douche or use tampons or scented sanitary pads.  Do not cross your legs for long periods of time.  Avoid cat litter boxes and soil used by cats. These carry germs that can cause birth defects in the baby and possibly loss of the fetus by miscarriage or stillbirth.  Avoid all smoking, herbs, alcohol, and medicines not prescribed by your health care provider. Chemicals in these affect the formation and growth of the baby.  Schedule a dentist appointment. At home, brush your teeth with a soft toothbrush and be gentle when you floss. SEEK MEDICAL CARE IF:   You have  dizziness.  You have mild pelvic cramps, pelvic pressure, or nagging pain in the abdominal area.  You have persistent nausea, vomiting, or diarrhea.  You have a bad smelling vaginal discharge.  You have pain with urination.  You notice increased swelling in your face, hands, legs, or ankles. SEEK IMMEDIATE MEDICAL CARE IF:   You have a fever.  You are leaking fluid from your vagina.  You have spotting or bleeding from your vagina.  You have severe abdominal cramping or pain.  You have rapid weight gain or loss.  You vomit blood or material that looks like coffee grounds.  You are exposed to German measles and have never had them.  You are exposed to fifth disease or chickenpox.  You develop a severe headache.  You have shortness of breath.  You have any kind of trauma, such as from a fall or a car accident. Document Released: 01/26/2001 Document Revised: 06/18/2013 Document Reviewed: 12/12/2012 [employer] Patient Information 2015 [employer]. This information is not intended to replace advice given to you by your health care provider. Make sure you discuss any questions you have with your health care provider.  

## 2014-08-08 LAB — PRENATAL PROFILE (SOLSTAS)
ANTIBODY SCREEN: NEGATIVE
BASOS ABS: 0 10*3/uL (ref 0.0–0.1)
BASOS PCT: 0 % (ref 0–1)
EOS ABS: 0 10*3/uL (ref 0.0–0.7)
EOS PCT: 0 % (ref 0–5)
HCT: 38.4 % (ref 36.0–46.0)
HIV: NONREACTIVE
Hemoglobin: 12.9 g/dL (ref 12.0–15.0)
Hepatitis B Surface Ag: NEGATIVE
Lymphocytes Relative: 24 % (ref 12–46)
Lymphs Abs: 2.4 10*3/uL (ref 0.7–4.0)
MCH: 30.4 pg (ref 26.0–34.0)
MCHC: 33.6 g/dL (ref 30.0–36.0)
MCV: 90.6 fL (ref 78.0–100.0)
MONO ABS: 0.7 10*3/uL (ref 0.1–1.0)
MPV: 8.7 fL (ref 8.6–12.4)
Monocytes Relative: 7 % (ref 3–12)
NEUTROS ABS: 6.9 10*3/uL (ref 1.7–7.7)
Neutrophils Relative %: 69 % (ref 43–77)
PLATELETS: 371 10*3/uL (ref 150–400)
RBC: 4.24 MIL/uL (ref 3.87–5.11)
RDW: 12.7 % (ref 11.5–15.5)
RH TYPE: POSITIVE
Rubella: 3.47 Index — ABNORMAL HIGH (ref <0.90)
WBC: 10 10*3/uL (ref 4.0–10.5)

## 2014-08-08 LAB — CYTOLOGY - PAP

## 2014-08-09 DIAGNOSIS — Z8742 Personal history of other diseases of the female genital tract: Secondary | ICD-10-CM

## 2014-08-09 HISTORY — DX: Personal history of other diseases of the female genital tract: Z87.42

## 2014-08-09 NOTE — Progress Notes (Signed)
   Subjective:    Maria Pugh is a G2P1001 [redacted]w[redacted]d being seen today for her first obstetrical visit.  Her obstetrical history is significant for nothing. Patient does intend to breast feed. Pregnancy history fully reviewed. Family history positive for autism and Soto's disease (infantile acromegaly). History of abnormal Pap and LEEP. Follow-up Paps have been normal.  Patient reports nausea, no contractions and no cramping.  Filed Vitals:   08/07/14 1358  BP: 117/74  Pulse: 81  Weight: 140 lb (63.504 kg)    HISTORY: OB History  Gravida Para Term Preterm AB SAB TAB Ectopic Multiple Living  2 1 1  0 0 0 0 0 0 1    # Outcome Date GA Lbr Len/2nd Weight Sex Delivery Anes PTL Lv  2 Current           1 Term 02/02/10 [redacted]w[redacted]d  6 lb 5 oz (2.863 kg) M Vag-Spont EPI N Y     Past Medical History  Diagnosis Date  . Shingles 2013 and 2014   Past Surgical History  Procedure Laterality Date  . Mouth surgery  07/2014   Family History  Problem Relation Age of Onset  . Mental illness Mother   . Thyroid disease Mother   . Diabetes Paternal Grandmother   . Autism Brother   . Graves' disease Mother   . Acromegaly Cousin      Exam    Uterus:   7 week-size. Pos fetal cardiac activity   Pelvic Exam:    Perineum: No Hemorrhoids, Normal Perineum   Vulva: normal   Vagina:  normal mucosa, normal discharge   pH: NA   Cervix: multiparous appearance and no bleeding following Pap   Adnexa: normal adnexa and no mass, fullness, tenderness   Bony Pelvis: proven to 7 lb  System: Breast:  Declined exam   Skin: normal coloration and turgor, no rashes    Neurologic: oriented, normal mood, gait normal; reflexes normal and symmetric, grossly non-focal   Extremities: normal strength, tone, and muscle mass   HEENT sclera clear, anicteric, neck supple with midline trachea and thyroid without masses   Mouth/Teeth mucous membranes moist, pharynx normal without lesions and dental hygiene good   Neck supple  and no masses   Cardiovascular: regular rate and rhythm, no murmurs or gallops   Respiratory:  appears well, vitals normal, no respiratory distress, acyanotic, normal RR, neck free of mass or lymphadenopathy, chest clear, no wheezing, crepitations, rhonchi, normal symmetric air entry   Abdomen: soft, non-tender; bowel sounds normal; no masses,  no organomegaly   Urinary: urethral meatus normal      Assessment:    Pregnancy: G2P1001   1. Normal pregnancy, antepartum  - Prenatal Profile - Culture, OB Urine - Cytology - PAP  History of LEEP.  Plan:    Initial labs drawn. Prenatal vitamins. Problem list reviewed and updated. Genetic Screening discussed First Screen: undecided. Ultrasound discussed; fetal survey: requested. Follow up in 4 weeks. Offered genetic counseling due to family history, but explained that cause of autism is not known. Patient declines for now.   Dorathy Kinsman 08/09/2014

## 2014-08-10 LAB — CULTURE, OB URINE: Colony Count: >=100000

## 2014-08-12 ENCOUNTER — Other Ambulatory Visit: Payer: Self-pay | Admitting: Advanced Practice Midwife

## 2014-08-12 DIAGNOSIS — O2341 Unspecified infection of urinary tract in pregnancy, first trimester: Secondary | ICD-10-CM

## 2014-08-12 DIAGNOSIS — O2340 Unspecified infection of urinary tract in pregnancy, unspecified trimester: Secondary | ICD-10-CM | POA: Insufficient documentation

## 2014-08-12 MED ORDER — NITROFURANTOIN MONOHYD MACRO 100 MG PO CAPS: 100.0000 mg | ORAL_CAPSULE | Freq: Two times a day (BID) | ORAL | Status: DC

## 2014-08-12 NOTE — Progress Notes (Signed)
UTI. Rx Macrobid.

## 2014-08-22 ENCOUNTER — Telehealth: Payer: Self-pay | Admitting: *Deleted

## 2014-08-22 DIAGNOSIS — B379 Candidiasis, unspecified: Secondary | ICD-10-CM

## 2014-08-22 MED ORDER — FLUCONAZOLE 150 MG PO TABS: 150.0000 mg | ORAL_TABLET | Freq: Once | ORAL | Status: DC

## 2014-08-22 NOTE — Telephone Encounter (Signed)
Patient called and needs something sent in for a yeast infection. I have sent in Diflucan.

## 2014-09-03 ENCOUNTER — Ambulatory Visit (INDEPENDENT_AMBULATORY_CARE_PROVIDER_SITE_OTHER): Payer: Medicaid Other | Admitting: Obstetrics & Gynecology

## 2014-09-03 VITALS — BP 108/74 | HR 98 | Wt 136.0 lb

## 2014-09-03 DIAGNOSIS — Z3490 Encounter for supervision of normal pregnancy, unspecified, unspecified trimester: Secondary | ICD-10-CM

## 2014-09-03 DIAGNOSIS — Z3481 Encounter for supervision of other normal pregnancy, first trimester: Secondary | ICD-10-CM

## 2014-09-03 DIAGNOSIS — O218 Other vomiting complicating pregnancy: Secondary | ICD-10-CM

## 2014-09-03 NOTE — Progress Notes (Signed)
Continues to have nausea/vomiting five times per day.  Needs note for dental procedures.

## 2014-09-03 NOTE — Progress Notes (Signed)
Subjective:  Maria Pugh is a 29 y.o. G2P1001 at 1363w6d being seen today for ongoing prenatal care.  Patient reports no complaints except for continued nausea and vomitting.  Contractions: Not present.  Vag. Bleeding: None. Movement: Absent. Denies leaking of fluid.   The following portions of the patient's history were reviewed and updated as appropriate: allergies, current medications, past family history, past medical history, past social history, past surgical history and problem list.   Objective:   Filed Vitals:   09/03/14 1323  BP: 108/74  Pulse: 98  Weight: 136 lb (61.689 kg)    Fetal Status: Fetal Heart Rate (bpm): 170   Movement: Absent     General:  Alert, oriented and cooperative. Patient is in no acute distress.  Skin: Skin is warm and dry. No rash noted.   Cardiovascular: Normal heart rate noted  Respiratory: Normal respiratory effort, no problems with respiration noted  Abdomen: Soft, gravid, appropriate for gestational age. Pain/Pressure: Absent     Vaginal: Vag. Bleeding: None.    Vag D/C Character: Thin  Cervix: Not evaluated        Extremities: Normal range of motion.  Edema: None  Mental Status: Normal mood and affect. Normal behavior. Normal judgment and thought content.   Urinalysis: Urine Protein: Trace Urine Glucose: Negative  Assessment and Plan:  Pregnancy: G2P1001 at 2563w6d First screen is ordered for this week.  She will get a note for her dentist so that she can have dental work.  I will prescribe phenergan supp prn  There are no diagnoses linked to this encounter. Preterm labor symptoms and general obstetric precautions including but not limited to vaginal bleeding, contractions, leaking of fluid and fetal movement were reviewed in detail with the patient. Please refer to After Visit Summary for other counseling recommendations.  No Follow-up on file.   Allie BossierMyra C Zunaira Lamy, MD

## 2014-09-11 ENCOUNTER — Other Ambulatory Visit: Payer: Self-pay | Admitting: Obstetrics & Gynecology

## 2014-09-11 DIAGNOSIS — Z3682 Encounter for antenatal screening for nuchal translucency: Secondary | ICD-10-CM

## 2014-09-11 DIAGNOSIS — Z3A12 12 weeks gestation of pregnancy: Secondary | ICD-10-CM

## 2014-09-12 ENCOUNTER — Encounter (HOSPITAL_COMMUNITY): Payer: Self-pay

## 2014-09-12 ENCOUNTER — Ambulatory Visit (HOSPITAL_COMMUNITY)
Admission: RE | Admit: 2014-09-12 | Discharge: 2014-09-12 | Disposition: A | Discharge status: Home / Self Care | Payer: Medicaid Other | Source: Ambulatory Visit | Attending: Obstetrics & Gynecology | Admitting: Obstetrics & Gynecology

## 2014-09-12 DIAGNOSIS — Z3682 Encounter for antenatal screening for nuchal translucency: Secondary | ICD-10-CM

## 2014-09-12 DIAGNOSIS — Z3A12 12 weeks gestation of pregnancy: Secondary | ICD-10-CM | POA: Diagnosis not present

## 2014-09-12 DIAGNOSIS — Z36 Encounter for antenatal screening of mother: Secondary | ICD-10-CM | POA: Diagnosis present

## 2014-09-19 ENCOUNTER — Other Ambulatory Visit (HOSPITAL_COMMUNITY): Payer: Self-pay | Admitting: Obstetrics & Gynecology

## 2014-09-22 ENCOUNTER — Emergency Department
Admission: EM | Admit: 2014-09-22 | Discharge: 2014-09-22 | Disposition: A | Discharge status: Home / Self Care | Payer: Medicaid Other | Attending: Emergency Medicine | Admitting: Emergency Medicine

## 2014-09-22 ENCOUNTER — Encounter: Payer: Self-pay | Admitting: Emergency Medicine

## 2014-09-22 DIAGNOSIS — L299 Pruritus, unspecified: Secondary | ICD-10-CM

## 2014-09-22 DIAGNOSIS — O9989 Other specified diseases and conditions complicating pregnancy, childbirth and the puerperium: Secondary | ICD-10-CM | POA: Diagnosis present

## 2014-09-22 DIAGNOSIS — L309 Dermatitis, unspecified: Secondary | ICD-10-CM | POA: Diagnosis not present

## 2014-09-22 DIAGNOSIS — Z3A14 14 weeks gestation of pregnancy: Secondary | ICD-10-CM | POA: Insufficient documentation

## 2014-09-22 DIAGNOSIS — O99711 Diseases of the skin and subcutaneous tissue complicating pregnancy, first trimester: Secondary | ICD-10-CM | POA: Diagnosis not present

## 2014-09-22 DIAGNOSIS — R21 Rash and other nonspecific skin eruption: Secondary | ICD-10-CM

## 2014-09-22 MED ORDER — DIPHENHYDRAMINE HCL 25 MG PO CAPS
25.0000 mg | ORAL_CAPSULE | Freq: Once | ORAL | Status: AC
  Administered 2014-09-22: 25 mg via ORAL
  Filled 2014-09-22: qty 1

## 2014-09-22 MED ORDER — DIPHENHYDRAMINE HCL 25 MG PO CAPS: 25.0000 mg | ORAL_CAPSULE | ORAL | Status: DC | PRN

## 2014-09-22 MED ORDER — TRIAMCINOLONE ACETONIDE 0.1 % EX OINT: 1.0000 "application " | TOPICAL_OINTMENT | Freq: Two times a day (BID) | CUTANEOUS | Status: DC

## 2014-09-22 MED ORDER — PREDNISONE 20 MG PO TABS
40.0000 mg | ORAL_TABLET | Freq: Once | ORAL | Status: AC
  Administered 2014-09-22: 40 mg via ORAL
  Filled 2014-09-22: qty 2

## 2014-09-22 NOTE — ED Notes (Signed)
MD at bedside. 

## 2014-09-22 NOTE — ED Notes (Addendum)
Pt says she noticed a patch of small red bumps on her left lower arm on Thursday; now with larger patches to both arms and legs; itching; no difficulty breathing; pt is pregnant-due date 03/26/15

## 2014-09-22 NOTE — Discharge Instructions (Signed)
Pruritus  Pruritus is an itch. There are many different problems that can cause an itch. Dry skin is one of the most common causes of itching. Most cases of itching do not require medical attention.  HOME CARE INSTRUCTIONS  Make sure your skin is moistened on a regular basis. A moisturizer that contains petroleum jelly is best for keeping moisture in your skin. If you develop a rash, you may try the following for relief:   Use corticosteroid cream.  Apply cool compresses to the affected areas.  Bathe with Epsom salts or baking soda in the bathwater.  Soak in colloidal oatmeal baths. These are available at your pharmacy.  Apply baking soda paste to the rash. Stir water into baking soda until it reaches a paste-like consistency.  Use an anti-itch lotion.  Take over-the-counter diphenhydramine medicine by mouth as the instructions direct.  Avoid scratching. Scratching may cause the rash to become infected. If itching is very bad, your caregiver may suggest prescription lotions or creams to lessen your symptoms.  Avoid hot showers, which can make itching worse. A cold shower may help with itching as long as you use a moisturizer after the shower. SEEK MEDICAL CARE IF: The itching does not go away after several days. Document Released: 10/14/2010 Document Revised: 06/18/2013 Document Reviewed: 10/14/2010 [facility] Patient Information 2015 [place], [place]. This information is not intended to replace advice given to you by your health care provider. Make sure you discuss any questions you have with your health care provider.  Contact Dermatitis Contact dermatitis is a reaction to certain substances that touch the skin. Contact dermatitis can be either irritant contact dermatitis or allergic contact dermatitis. Irritant contact dermatitis does not require previous exposure to the substance for a reaction to occur.Allergic contact dermatitis only occurs if you have been exposed to the substance  before. Upon a repeat exposure, your body reacts to the substance.  CAUSES  Many substances can cause contact dermatitis. Irritant dermatitis is most commonly caused by repeated exposure to mildly irritating substances, such as:  Makeup.  Soaps.  Detergents.  Bleaches.  Acids.  Metal salts, such as nickel. Allergic contact dermatitis is most commonly caused by exposure to:  Poisonous plants.  Chemicals (deodorants, shampoos).  Jewelry.  Latex.  Neomycin in triple antibiotic cream.  Preservatives in products, including clothing. SYMPTOMS  The area of skin that is exposed may develop:  Dryness or flaking.  Redness.  Cracks.  Itching.  Pain or a burning sensation.  Blisters. With allergic contact dermatitis, there may also be swelling in areas such as the eyelids, mouth, or genitals.  DIAGNOSIS  Your caregiver can usually tell what the problem is by doing a physical exam. In cases where the cause is uncertain and an allergic contact dermatitis is suspected, a patch skin test may be performed to help determine the cause of your dermatitis. TREATMENT Treatment includes protecting the skin from further contact with the irritating substance by avoiding that substance if possible. Barrier creams, powders, and gloves may be helpful. Your caregiver may also recommend:  Steroid creams or ointments applied 2 times daily. For best results, soak the rash area in cool water for 20 minutes. Then apply the medicine. Cover the area with a plastic wrap. You can store the steroid cream in the refrigerator for a "chilly" effect on your rash. That may decrease itching. Oral steroid medicines may be needed in more severe cases.  Antibiotics or antibacterial ointments if a skin infection is present.  Antihistamine  lotion or an antihistamine taken by mouth to ease itching.  Lubricants to keep moisture in your skin.  Burow's solution to reduce redness and soreness or to dry a weeping  rash. Mix one packet or tablet of solution in 2 cups cool water. Dip a clean washcloth in the mixture, wring it out a bit, and put it on the affected area. Leave the cloth in place for 30 minutes. Do this as often as possible throughout the day.  Taking several cornstarch or baking soda baths daily if the area is too large to cover with a washcloth. Harsh chemicals, such as alkalis or acids, can cause skin damage that is like a burn. You should flush your skin for 15 to 20 minutes with cold water after such an exposure. You should also seek immediate medical care after exposure. Bandages (dressings), antibiotics, and pain medicine may be needed for severely irritated skin.  HOME CARE INSTRUCTIONS  Avoid the substance that caused your reaction.  Keep the area of skin that is affected away from hot water, soap, sunlight, chemicals, acidic substances, or anything else that would irritate your skin.  Do not scratch the rash. Scratching may cause the rash to become infected.  You may take cool baths to help stop the itching.  Only take over-the-counter or prescription medicines as directed by your caregiver.  See your caregiver for follow-up care as directed to make sure your skin is healing properly. SEEK MEDICAL CARE IF:   Your condition is not better after 3 days of treatment.  You seem to be getting worse.  You see signs of infection such as swelling, tenderness, redness, soreness, or warmth in the affected area.  You have any problems related to your medicines. Document Released: 01/30/2000 Document Revised: 04/26/2011 Document Reviewed: 07/07/2010 [facility] Patient Information 2015 [place], [place]. This information is not intended to replace advice given to you by your health care provider. Make sure you discuss any questions you have with your health care provider.

## 2014-09-22 NOTE — ED Provider Notes (Signed)
[facility] Emergency Department Provider Note  ____________________________________________  Time seen: Approximately 414 AM  I have reviewed the triage vital signs and the nursing notes.   HISTORY  Chief Complaint Rash    HPI Maria Pugh is a 29 y.o. female is [redacted] weeks pregnant and has an itchy rash. The patient reports that the rash started out as little spots on her left arm and it seemed to spread. The patient reports that she has been trying to take showers to prevent the spread of this rash but it has not been helping. The patient called her on-call physician's number and was told to come into the emergency department for evaluation of pregnant rash. She first noticed the rash approximately 4 days ago. She reports that is on her arms somewhat her face and stomach as well as some areas on her chest. The rash is very itchy and some of the areas are raised and symptoms aren't.The patient does not remember any new contacts any change in body soaps or body wash. She reports that her OB/GYN's office was concerned given her pregnancy history so they wanted her evaluated.   Past Medical History  Diagnosis Date  . Shingles 2013 and 2014    Patient Active Problem List   Diagnosis Date Noted  . [redacted] weeks gestation of pregnancy   . Encounter for (NT) nuchal translucency scan   . UTI in pregnancy 08/12/2014  . History of abnormal cervical Pap smear 08/09/2014  . Normal pregnancy, antepartum 08/07/2014    Past Surgical History  Procedure Laterality Date  . Mouth surgery  07/2014    Current Outpatient Rx  Name  Route  Sig  Dispense  Refill  . diphenhydrAMINE (BENADRYL) 25 mg capsule   Oral   Take 1 capsule (25 mg total) by mouth every 4 (four) hours as needed.   20 capsule   0   . Doxylamine-Pyridoxine (DICLEGIS) 10-10 MG TBEC   Oral   Take by mouth.         . multivitamin (VIT W/EXTRA C) CHEW chewable tablet   Oral   Chew 1 tablet by mouth  daily.         [name] triamcinolone ointment (KENALOG) 0.1 %   Topical   Apply 1 application topically 2 (two) times daily.   30 g   0     Allergies Review of patient's allergies indicates no known allergies.  Family History  Problem Relation Age of Onset  . Mental illness Mother   . Thyroid disease Mother   . Diabetes Paternal Grandmother   . Autism Brother   . Graves' disease Mother   . Acromegaly Cousin     Social History History  Substance Use Topics  . Smoking status: Never Smoker   . Smokeless tobacco: Never Used  . Alcohol Use: No    Review of Systems Constitutional: No fever/chills Eyes: No visual changes. ENT: No sore throat. Cardiovascular: Denies chest pain. Respiratory: Denies shortness of breath. Gastrointestinal: No abdominal pain.  No nausea, no vomiting.  No diarrhea.  No constipation. Genitourinary: Negative for dysuria. Musculoskeletal: Negative for back pain. Skin: rash. Neurological: Negative for headaches, focal weakness or numbness.  10-point ROS otherwise negative.  ____________________________________________   PHYSICAL EXAM:  VITAL SIGNS: ED Triage Vitals  Enc Vitals Group     BP 09/22/14 0126 120/80 mmHg     Pulse Rate 09/22/14 0126 109     Resp 09/22/14 0126 18     Temp  09/22/14 0126 98.1 F (36.7 C)     Temp Source 09/22/14 0126 Oral     SpO2 09/22/14 0126 99 %     Weight 09/22/14 0126 136 lb (61.689 kg)     Height 09/22/14 0126  (1.575 m)     Head Cir --      Peak Flow --      Pain Score 09/22/14 0127 0     Pain Loc --      Pain Edu? --      Excl. in GC? --     Constitutional: Alert and oriented. Well appearing and in mild distress. Eyes: Conjunctivae are normal. PERRL. EOMI. Head: Atraumatic. Nose: No congestion/rhinnorhea. Mouth/Throat: Mucous membranes are moist.  Oropharynx non-erythematous. Cardiovascular: Normal rate, regular rhythm. Grossly normal heart sounds.  Good peripheral circulation. Respiratory:  Normal respiratory effort.  No retractions. Lungs CTAB. Gastrointestinal: Soft and nontender. No distention. No abdominal bruits. No CVA tenderness. Musculoskeletal: No lower extremity tenderness nor edema. Neurologic:  Normal speech and language.  Skin:  Maculopapular rash to the patient's bilateral forearms chest and minimal areas of her stomach. Psychiatric: Mood and affect are normal.  ____________________________________________   LABS (all labs ordered are listed, but only abnormal results are displayed)  Labs Reviewed - No data to display ____________________________________________  EKG  None ____________________________________________  RADIOLOGY  None ____________________________________________   PROCEDURES  Procedure(s) performed: None  Critical Care performed: No  ____________________________________________   INITIAL IMPRESSION / ASSESSMENT AND PLAN / ED COURSE  Pertinent labs & imaging results that were available during my care of the patient were reviewed by me and considered in my medical decision making (see chart for details).  This is a 29 year old female who is [redacted] weeks pregnant and comes in with a rash to her arms and legs. The area is small red bumps that are slightly raised with the appearance of hives. Although the patient is in her second trimester there is always a possibility of papular pruritic urticaria of pregnancy. I did give the patient is dose of Benadryl as well as prednisone 40 mg orally times one dose. The patient will be discharged home with some cream and will follow-up with her OB/GYN. ____________________________________________   FINAL CLINICAL IMPRESSION(S) / ED DIAGNOSES  Final diagnoses:  Rash  Itching  Papular dermatitis of pregnancy in first trimester, antepartum      Rebecka Apley, MD 09/22/14 [phone]

## 2014-09-24 ENCOUNTER — Other Ambulatory Visit (HOSPITAL_COMMUNITY): Payer: Self-pay | Admitting: Obstetrics & Gynecology

## 2014-09-30 ENCOUNTER — Ambulatory Visit (INDEPENDENT_AMBULATORY_CARE_PROVIDER_SITE_OTHER): Payer: Medicaid Other | Admitting: Family Medicine

## 2014-09-30 VITALS — BP 102/68 | HR 87 | Wt 138.0 lb

## 2014-09-30 DIAGNOSIS — Z3482 Encounter for supervision of other normal pregnancy, second trimester: Secondary | ICD-10-CM

## 2014-09-30 DIAGNOSIS — O219 Vomiting of pregnancy, unspecified: Secondary | ICD-10-CM

## 2014-09-30 DIAGNOSIS — R21 Rash and other nonspecific skin eruption: Secondary | ICD-10-CM

## 2014-09-30 DIAGNOSIS — Z3490 Encounter for supervision of normal pregnancy, unspecified, unspecified trimester: Secondary | ICD-10-CM

## 2014-09-30 MED ORDER — DOXYLAMINE-PYRIDOXINE 10-10 MG PO TBEC: 1.0000 | DELAYED_RELEASE_TABLET | Freq: Four times a day (QID) | ORAL | Status: DC | PRN

## 2014-09-30 NOTE — Patient Instructions (Signed)
Second Trimester of Pregnancy The second trimester is from week 13 through week 28, months 4 through 6. The second trimester is often a time when you feel your best. Your body has also adjusted to being pregnant, and you begin to feel better physically. Usually, morning sickness has lessened or quit completely, you may have more energy, and you may have an increase in appetite. The second trimester is also a time when the fetus is growing rapidly. At the end of the sixth month, the fetus is about 9 inches long and weighs about 1 pounds. You will likely begin to feel the baby move (quickening) between 18 and 20 weeks of the pregnancy. BODY CHANGES Your body goes through many changes during pregnancy. The changes vary from woman to woman.   Your weight will continue to increase. You will notice your lower abdomen bulging out.  You may begin to get stretch marks on your hips, abdomen, and breasts.  You may develop headaches that can be relieved by medicines approved by your health care provider.  You may urinate more often because the fetus is pressing on your bladder.  You may develop or continue to have heartburn as a result of your pregnancy.  You may develop constipation because certain hormones are causing the muscles that push waste through your intestines to slow down.  You may develop hemorrhoids or swollen, bulging veins (varicose veins).  You may have back pain because of the weight gain and pregnancy hormones relaxing your joints between the bones in your pelvis and as a result of a shift in weight and the muscles that support your balance.  Your breasts will continue to grow and be tender.  Your gums may bleed and may be sensitive to brushing and flossing.  Dark spots or blotches (chloasma, mask of pregnancy) may develop on your face. This will likely fade after the baby is born.  A dark line from your belly button to the pubic area (linea nigra) may appear. This will likely  fade after the baby is born.  You may have changes in your hair. These can include thickening of your hair, rapid growth, and changes in texture. Some women also have hair loss during or after pregnancy, or hair that feels dry or thin. Your hair will most likely return to normal after your baby is born. WHAT TO EXPECT AT YOUR PRENATAL VISITS During a routine prenatal visit:  You will be weighed to make sure you and the fetus are growing normally.  Your blood pressure will be taken.  Your abdomen will be measured to track your baby's growth.  The fetal heartbeat will be listened to.  Any test results from the previous visit will be discussed. Your health care provider may ask you:  How you are feeling.  If you are feeling the baby move.  If you have had any abnormal symptoms, such as leaking fluid, bleeding, severe headaches, or abdominal cramping.  If you have any questions. Other tests that may be performed during your second trimester include:  Blood tests that check for:  Low iron levels (anemia).  Gestational diabetes (between 24 and 28 weeks).  Rh antibodies.  Urine tests to check for infections, diabetes, or protein in the urine.  An ultrasound to confirm the proper growth and development of the baby.  An amniocentesis to check for possible genetic problems.  Fetal screens for spina bifida and Down syndrome. HOME CARE INSTRUCTIONS   Avoid all smoking, herbs, alcohol, and unprescribed   drugs. These chemicals affect the formation and growth of the baby.  Follow your health care provider's instructions regarding medicine use. There are medicines that are either safe or unsafe to take during pregnancy.  Exercise only as directed by your health care provider. Experiencing uterine cramps is a good sign to stop exercising.  Continue to eat regular, healthy meals.  Wear a good support bra for breast tenderness.  Do not use hot tubs, steam rooms, or saunas.  Wear  your seat belt at all times when driving.  Avoid raw meat, uncooked cheese, cat litter boxes, and soil used by cats. These carry germs that can cause birth defects in the baby.  Take your prenatal vitamins.  Try taking a stool softener (if your health care provider approves) if you develop constipation. Eat more high-fiber foods, such as fresh vegetables or fruit and whole grains. Drink plenty of fluids to keep your urine clear or pale yellow.  Take warm sitz baths to soothe any pain or discomfort caused by hemorrhoids. Use hemorrhoid cream if your health care provider approves.  If you develop varicose veins, wear support hose. Elevate your feet for 15 minutes, 3-4 times a day. Limit salt in your diet.  Avoid heavy lifting, wear low heel shoes, and practice good posture.  Rest with your legs elevated if you have leg cramps or low back pain.  Visit your dentist if you have not gone yet during your pregnancy. Use a soft toothbrush to brush your teeth and be gentle when you floss.  A sexual relationship may be continued unless your health care provider directs you otherwise.  Continue to go to all your prenatal visits as directed by your health care provider. SEEK MEDICAL CARE IF:   You have dizziness.  You have mild pelvic cramps, pelvic pressure, or nagging pain in the abdominal area.  You have persistent nausea, vomiting, or diarrhea.  You have a bad smelling vaginal discharge.  You have pain with urination. SEEK IMMEDIATE MEDICAL CARE IF:   You have a fever.  You are leaking fluid from your vagina.  You have spotting or bleeding from your vagina.  You have severe abdominal cramping or pain.  You have rapid weight gain or loss.  You have shortness of breath with chest pain.  You notice sudden or extreme swelling of your face, hands, ankles, feet, or legs.  You have not felt your baby move in over an hour.  You have severe headaches that do not go away with  medicine.  You have vision changes. Document Released: 01/26/2001 Document Revised: 02/06/2013 Document Reviewed: 04/04/2012 [employer] Patient Information 2015 [employer]. This information is not intended to replace advice given to you by your health care provider. Make sure you discuss any questions you have with your health care provider.  Breastfeeding Deciding to breastfeed is one of the best choices you can make for you and your baby. A change in hormones during pregnancy causes your breast tissue to grow and increases the number and size of your milk ducts. These hormones also allow proteins, sugars, and fats from your blood supply to make breast milk in your milk-producing glands. Hormones prevent breast milk from being released before your baby is born as well as prompt milk flow after birth. Once breastfeeding has begun, thoughts of your baby, as well as his or her sucking or crying, can stimulate the release of milk from your milk-producing glands.  BENEFITS OF BREASTFEEDING For Your Baby  Your first   milk (colostrum) helps your baby's digestive system function better.   There are antibodies in your milk that help your baby fight off infections.   Your baby has a lower incidence of asthma, allergies, and sudden infant death syndrome.   The nutrients in breast milk are better for your baby than infant formulas and are designed uniquely for your baby's needs.   Breast milk improves your baby's brain development.   Your baby is less likely to develop other conditions, such as childhood obesity, asthma, or type 2 diabetes mellitus.  For You   Breastfeeding helps to create a very special bond between you and your baby.   Breastfeeding is convenient. Breast milk is always available at the correct temperature and costs nothing.   Breastfeeding helps to burn calories and helps you lose the weight gained during pregnancy.   Breastfeeding makes your uterus contract to its  prepregnancy size faster and slows bleeding (lochia) after you give birth.   Breastfeeding helps to lower your risk of developing type 2 diabetes mellitus, osteoporosis, and breast or ovarian cancer later in life. SIGNS THAT YOUR BABY IS HUNGRY Early Signs of Hunger  Increased alertness or activity.  Stretching.  Movement of the head from side to side.  Movement of the head and opening of the mouth when the corner of the mouth or cheek is stroked (rooting).  Increased sucking sounds, smacking lips, cooing, sighing, or squeaking.  Hand-to-mouth movements.  Increased sucking of fingers or hands. Late Signs of Hunger  Fussing.  Intermittent crying. Extreme Signs of Hunger Signs of extreme hunger will require calming and consoling before your baby will be able to breastfeed successfully. Do not wait for the following signs of extreme hunger to occur before you initiate breastfeeding:   Restlessness.  A loud, strong cry.   Screaming. BREASTFEEDING BASICS Breastfeeding Initiation  Find a comfortable place to sit or lie down, with your neck and back well supported.  Place a pillow or rolled up blanket under your baby to bring him or her to the level of your breast (if you are seated). Nursing pillows are specially designed to help support your arms and your baby while you breastfeed.  Make sure that your baby's abdomen is facing your abdomen.   Gently massage your breast. With your fingertips, massage from your chest wall toward your nipple in a circular motion. This encourages milk flow. You may need to continue this action during the feeding if your milk flows slowly.  Support your breast with 4 fingers underneath and your thumb above your nipple. Make sure your fingers are well away from your nipple and your baby's mouth.   Stroke your baby's lips gently with your finger or nipple.   When your baby's mouth is open wide enough, quickly bring your baby to your breast,  placing your entire nipple and as much of the colored area around your nipple (areola) as possible into your baby's mouth.   More areola should be visible above your baby's upper lip than below the lower lip.   Your baby's tongue should be between his or her lower gum and your breast.   Ensure that your baby's mouth is correctly positioned around your nipple (latched). Your baby's lips should create a seal on your breast and be turned out (everted).  It is common for your baby to suck about 2-3 minutes in order to start the flow of breast milk. Latching Teaching your baby how to latch on to your breast   properly is very important. An improper latch can cause nipple pain and decreased milk supply for you and poor weight gain in your baby. Also, if your baby is not latched onto your nipple properly, he or she may swallow some air during feeding. This can make your baby fussy. Burping your baby when you switch breasts during the feeding can help to get rid of the air. However, teaching your baby to latch on properly is still the best way to prevent fussiness from swallowing air while breastfeeding. Signs that your baby has successfully latched on to your nipple:    Silent tugging or silent sucking, without causing you pain.   Swallowing heard between every 3-4 sucks.    Muscle movement above and in front of his or her ears while sucking.  Signs that your baby has not successfully latched on to nipple:   Sucking sounds or smacking sounds from your baby while breastfeeding.  Nipple pain. If you think your baby has not latched on correctly, slip your finger into the corner of your baby's mouth to break the suction and place it between your baby's gums. Attempt breastfeeding initiation again. Signs of Successful Breastfeeding Signs from your baby:   A gradual decrease in the number of sucks or complete cessation of sucking.   Falling asleep.   Relaxation of his or her body.    Retention of a small amount of milk in his or her mouth.   Letting go of your breast by himself or herself. Signs from you:  Breasts that have increased in firmness, weight, and size 1-3 hours after feeding.   Breasts that are softer immediately after breastfeeding.  Increased milk volume, as well as a change in milk consistency and color by the fifth day of breastfeeding.   Nipples that are not sore, cracked, or bleeding. Signs That Your Baby is Getting Enough Milk  Wetting at least 3 diapers in a 24-hour period. The urine should be clear and pale yellow by age 5 days.  At least 3 stools in a 24-hour period by age 5 days. The stool should be soft and yellow.  At least 3 stools in a 24-hour period by age 7 days. The stool should be seedy and yellow.  No loss of weight greater than 10% of birth weight during the first 3 days of age.  Average weight gain of 4-7 ounces (113-198 g) per week after age 4 days.  Consistent daily weight gain by age 5 days, without weight loss after the age of 2 weeks. After a feeding, your baby may spit up a small amount. This is common. BREASTFEEDING FREQUENCY AND DURATION Frequent feeding will help you make more milk and can prevent sore nipples and breast engorgement. Breastfeed when you feel the need to reduce the fullness of your breasts or when your baby shows signs of hunger. This is called "breastfeeding on demand." Avoid introducing a pacifier to your baby while you are working to establish breastfeeding (the first 4-6 weeks after your baby is born). After this time you may choose to use a pacifier. Research has shown that pacifier use during the first year of a baby's life decreases the risk of sudden infant death syndrome (SIDS). Allow your baby to feed on each breast as long as he or she wants. Breastfeed until your baby is finished feeding. When your baby unlatches or falls asleep while feeding from the first breast, offer the second breast.  Because newborns are often sleepy in the   first few weeks of life, you may need to awaken your baby to get him or her to feed. Breastfeeding times will vary from baby to baby. However, the following rules can serve as a guide to help you ensure that your baby is properly fed:  Newborns (babies 4 weeks of age or younger) may breastfeed every 1-3 hours.  Newborns should not go longer than 3 hours during the day or 5 hours during the night without breastfeeding.  You should breastfeed your baby a minimum of 8 times in a 24-hour period until you begin to introduce solid foods to your baby at around 6 months of age. BREAST MILK PUMPING Pumping and storing breast milk allows you to ensure that your baby is exclusively fed your breast milk, even at times when you are unable to breastfeed. This is especially important if you are going back to work while you are still breastfeeding or when you are not able to be present during feedings. Your lactation consultant can give you guidelines on how long it is safe to store breast milk.  A breast pump is a machine that allows you to pump milk from your breast into a sterile bottle. The pumped breast milk can then be stored in a refrigerator or freezer. Some breast pumps are operated by hand, while others use electricity. Ask your lactation consultant which type will work best for you. Breast pumps can be purchased, but some hospitals and breastfeeding support groups lease breast pumps on a monthly basis. A lactation consultant can teach you how to hand express breast milk, if you prefer not to use a pump.  CARING FOR YOUR BREASTS WHILE YOU BREASTFEED Nipples can become dry, cracked, and sore while breastfeeding. The following recommendations can help keep your breasts moisturized and healthy:  Avoid using soap on your nipples.   Wear a supportive bra. Although not required, special nursing bras and tank tops are designed to allow access to your breasts for  breastfeeding without taking off your entire bra or top. Avoid wearing underwire-style bras or extremely tight bras.  Air dry your nipples for 3-4minutes after each feeding.   Use only cotton bra pads to absorb leaked breast milk. Leaking of breast milk between feedings is normal.   Use lanolin on your nipples after breastfeeding. Lanolin helps to maintain your skin's normal moisture barrier. If you use pure lanolin, you do not need to wash it off before feeding your baby again. Pure lanolin is not toxic to your baby. You may also hand express a few drops of breast milk and gently massage that milk into your nipples and allow the milk to air dry. In the first few weeks after giving birth, some women experience extremely full breasts (engorgement). Engorgement can make your breasts feel heavy, warm, and tender to the touch. Engorgement peaks within 3-5 days after you give birth. The following recommendations can help ease engorgement:  Completely empty your breasts while breastfeeding or pumping. You may want to start by applying warm, moist heat (in the shower or with warm water-soaked hand towels) just before feeding or pumping. This increases circulation and helps the milk flow. If your baby does not completely empty your breasts while breastfeeding, pump any extra milk after he or she is finished.  Wear a snug bra (nursing or regular) or tank top for 1-2 days to signal your body to slightly decrease milk production.  Apply ice packs to your breasts, unless this is too uncomfortable for you.    Make sure that your baby is latched on and positioned properly while breastfeeding. If engorgement persists after 48 hours of following these recommendations, contact your health care provider or a lactation consultant. OVERALL HEALTH CARE RECOMMENDATIONS WHILE BREASTFEEDING  Eat healthy foods. Alternate between meals and snacks, eating 3 of each per day. Because what you eat affects your breast milk,  some of the foods may make your baby more irritable than usual. Avoid eating these foods if you are sure that they are negatively affecting your baby.  Drink milk, fruit juice, and water to satisfy your thirst (about 10 glasses a day).   Rest often, relax, and continue to take your prenatal vitamins to prevent fatigue, stress, and anemia.  Continue breast self-awareness checks.  Avoid chewing and smoking tobacco.  Avoid alcohol and drug use. Some medicines that may be harmful to your baby can pass through breast milk. It is important to ask your health care provider before taking any medicine, including all over-the-counter and prescription medicine as well as vitamin and herbal supplements. It is possible to become pregnant while breastfeeding. If birth control is desired, ask your health care provider about options that will be safe for your baby. SEEK MEDICAL CARE IF:   You feel like you want to stop breastfeeding or have become frustrated with breastfeeding.  You have painful breasts or nipples.  Your nipples are cracked or bleeding.  Your breasts are red, tender, or warm.  You have a swollen area on either breast.  You have a fever or chills.  You have nausea or vomiting.  You have drainage other than breast milk from your nipples.  Your breasts do not become full before feedings by the fifth day after you give birth.  You feel sad and depressed.  Your baby is too sleepy to eat well.  Your baby is having trouble sleeping.   Your baby is wetting less than 3 diapers in a 24-hour period.  Your baby has less than 3 stools in a 24-hour period.  Your baby's skin or the white part of his or her eyes becomes yellow.   Your baby is not gaining weight by 5 days of age. SEEK IMMEDIATE MEDICAL CARE IF:   Your baby is overly tired (lethargic) and does not want to wake up and feed.  Your baby develops an unexplained fever. Document Released: 02/01/2005 Document Revised:  02/06/2013 Document Reviewed: 07/26/2012 [employer] Patient Information 2015 [employer]. This information is not intended to replace advice given to you by your health care provider. Make sure you discuss any questions you have with your health care provider.  

## 2014-09-30 NOTE — Progress Notes (Signed)
Subjective:  Maria Pugh is a 29 y.o. G2P1001 at [redacted]w[redacted]d being seen today for ongoing prenatal care.  Patient reports no complaints.  Contractions: Not present.  Vag. Bleeding: None. Movement: Absent. Denies leaking of fluid.   The following portions of the patient's history were reviewed and updated as appropriate: allergies, current medications, past family history, past medical history, past social history, past surgical history and problem list.   Objective:   Filed Vitals:   09/30/14 1529  BP: 102/68  Pulse: 87  Weight: 138 lb (62.596 kg)    Fetal Status: Fetal Heart Rate (bpm): 154   Movement: Absent     General:  Alert, oriented and cooperative. Patient is in no acute distress.  Skin: Skin is warm and dry. No rash noted.   Cardiovascular: Normal heart rate noted  Respiratory: Normal respiratory effort, no problems with respiration noted  Abdomen: Soft, gravid, appropriate for gestational age. Pain/Pressure: Absent     Pelvic: Vag. Bleeding: None Vag D/C Character: Thin    Extremities: Normal range of motion.  Edema: None  Mental Status: Normal mood and affect. Normal behavior. Normal judgment and thought content.   Urinalysis: Urine Protein: Negative Urine Glucose: Negative  Assessment and Plan:  Pregnancy: G2P1001 at [redacted]w[redacted]d  1. Normal pregnancy, antepartum Continue routine prenatal care.  - [place] MFM OB DETAIL + 14 WK; Future  2. Nausea and vomiting of pregnancy, antepartum Refilled her Diclegis - Doxylamine-Pyridoxine (DICLEGIS) 10-10 MG TBEC; Take 1 tablet by mouth 4 (four) times daily as needed.  Dispense: 100 tablet; Refill: 3  3. Rash and nonspecific skin eruption Unsure of the etiology--will refer - Ambulatory referral to Dermatology  Please refer to After Visit Summary for other counseling recommendations.  Return in 4 weeks (on 10/28/2014).   Reva Bores, MD

## 2014-10-08 ENCOUNTER — Other Ambulatory Visit: Payer: Self-pay | Admitting: *Deleted

## 2014-10-08 NOTE — Telephone Encounter (Signed)
error 

## 2014-10-28 ENCOUNTER — Ambulatory Visit (HOSPITAL_COMMUNITY)
Admission: RE | Admit: 2014-10-28 | Discharge: 2014-10-28 | Disposition: A | Discharge status: Home / Self Care | Payer: Medicaid Other | Source: Ambulatory Visit | Attending: Family Medicine | Admitting: Family Medicine

## 2014-10-28 ENCOUNTER — Other Ambulatory Visit: Payer: Self-pay | Admitting: Family Medicine

## 2014-10-28 ENCOUNTER — Ambulatory Visit (INDEPENDENT_AMBULATORY_CARE_PROVIDER_SITE_OTHER): Payer: Medicaid Other | Admitting: Obstetrics & Gynecology

## 2014-10-28 VITALS — BP 112/77 | HR 93 | Wt 143.0 lb

## 2014-10-28 DIAGNOSIS — Z23 Encounter for immunization: Secondary | ICD-10-CM | POA: Diagnosis not present

## 2014-10-28 DIAGNOSIS — Z36 Encounter for antenatal screening of mother: Secondary | ICD-10-CM | POA: Diagnosis not present

## 2014-10-28 DIAGNOSIS — Z3490 Encounter for supervision of normal pregnancy, unspecified, unspecified trimester: Secondary | ICD-10-CM

## 2014-10-28 DIAGNOSIS — O4420 Partial placenta previa NOS or without hemorrhage, unspecified trimester: Secondary | ICD-10-CM

## 2014-10-28 DIAGNOSIS — Z3482 Encounter for supervision of other normal pregnancy, second trimester: Secondary | ICD-10-CM

## 2014-10-28 NOTE — Progress Notes (Signed)
Subjective:  Maria Pugh is a 29 y.o. G2P1001 at [redacted]w[redacted]d being seen today for ongoing prenatal care.  Patient reports no complaints.  Contractions: Not present.  Vag. Bleeding: None. Movement: Present. Denies leaking of fluid.   The following portions of the patient's history were reviewed and updated as appropriate: allergies, current medications, past family history, past medical history, past social history, past surgical history and problem list.   Objective:   Filed Vitals:   10/28/14 1420  BP: 112/77  Pulse: 93  Weight: 143 lb (64.864 kg)    Fetal Status: Fetal Heart Rate (bpm): 145   Movement: Present     General:  Alert, oriented and cooperative. Patient is in no acute distress.  Skin: Skin is warm and dry. No rash noted.   Cardiovascular: Normal heart rate noted  Respiratory: Normal respiratory effort, no problems with respiration noted  Abdomen: Soft, gravid, appropriate for gestational age. Pain/Pressure: Absent     Pelvic: Vag. Bleeding: None Vag D/C Character: Thin   Cervical exam deferred        Extremities: Normal range of motion.  Edema: None  Mental Status: Normal mood and affect. Normal behavior. Normal judgment and thought content.   Urinalysis: Urine Protein: Negative Urine Glucose: Negative  Assessment and Plan:  Pregnancy: G2P1001 at [redacted]w[redacted]d  1. Normal pregnancy, antepartum  - [place] MFM OB FOLLOW UP; Future (follow up on incomplete anatomy and placental location) - Flu Vaccine QUAD 36+ mos IM (Fluarix, Quad PF) - Alpha fetoprotein, maternal  Preterm labor symptoms and general obstetric precautions including but not limited to vaginal bleeding, contractions, leaking of fluid and fetal movement were reviewed in detail with the patient. Please refer to After Visit Summary for other counseling recommendations.  Return in about 4 weeks (around 11/25/2014).   Allie Bossier, MD

## 2014-10-29 DIAGNOSIS — O4420 Partial placenta previa NOS or without hemorrhage, unspecified trimester: Secondary | ICD-10-CM | POA: Insufficient documentation

## 2014-10-31 LAB — ALPHA FETOPROTEIN, MATERNAL
AFP: 62 ng/mL
Curr Gest Age: 14.3 wks.days
MoM for AFP: 2.22
Open Spina bifida: NEGATIVE
Osb Risk: 1:445 {titer}

## 2014-11-29 ENCOUNTER — Encounter: Payer: Self-pay | Admitting: Obstetrics & Gynecology

## 2014-11-29 ENCOUNTER — Ambulatory Visit (INDEPENDENT_AMBULATORY_CARE_PROVIDER_SITE_OTHER): Payer: Medicaid Other | Admitting: Obstetrics & Gynecology

## 2014-11-29 VITALS — BP 109/74 | HR 88 | Wt 148.0 lb

## 2014-11-29 DIAGNOSIS — Z3482 Encounter for supervision of other normal pregnancy, second trimester: Secondary | ICD-10-CM

## 2014-11-29 DIAGNOSIS — Z3490 Encounter for supervision of normal pregnancy, unspecified, unspecified trimester: Secondary | ICD-10-CM

## 2014-11-29 NOTE — Progress Notes (Signed)
Subjective:  Maria Pugh is a 29 y.o. MW G2P1001 (27yo son) at 8342w2d being seen today for ongoing prenatal care.  Patient reports no complaints.  Contractions: Not present.  Vag. Bleeding: None. Movement: Present. Denies leaking of fluid.   The following portions of the patient's history were reviewed and updated as appropriate: allergies, current medications, past family history, past medical history, past social history, past surgical history and problem list. Problem list updated.  Objective:   Filed Vitals:   11/29/14 0919  BP: 109/74  Pulse: 88  Weight: 148 lb (67.132 kg)    Fetal Status: Fetal Heart Rate (bpm): 147 Fundal Height: 24 cm Movement: Present     General:  Alert, oriented and cooperative. Patient is in no acute distress.  Skin: Skin is warm and dry. No rash noted.   Cardiovascular: Normal heart rate noted  Respiratory: Normal respiratory effort, no problems with respiration noted  Abdomen: Soft, gravid, appropriate for gestational age. Pain/Pressure: Absent     Pelvic: Vag. Bleeding: None Vag D/C Character: Thin   Cervical exam deferred        Extremities: Normal range of motion.  Edema: None  Mental Status: Normal mood and affect. Normal behavior. Normal judgment and thought content.   Urinalysis: Urine Protein: Negative Urine Glucose: Negative  Assessment and Plan:  Pregnancy: G2P1001 at 9742w2d  1. Normal pregnancy, antepartum - follow up u/s next Monday - glucola at next visit  Preterm labor symptoms and general obstetric precautions including but not limited to vaginal bleeding, contractions, leaking of fluid and fetal movement were reviewed in detail with the patient. Please refer to After Visit Summary for other counseling recommendations.  Return in about 4 weeks (around 12/27/2014) for glucola.   Allie BossierMyra C Kaylene Dawn, MD

## 2014-12-02 ENCOUNTER — Encounter: Payer: Medicaid Other | Admitting: Family Medicine

## 2014-12-02 ENCOUNTER — Ambulatory Visit (HOSPITAL_COMMUNITY)
Admission: RE | Admit: 2014-12-02 | Discharge: 2014-12-02 | Disposition: A | Discharge status: Home / Self Care | Payer: Medicaid Other | Source: Ambulatory Visit | Attending: Obstetrics & Gynecology | Admitting: Obstetrics & Gynecology

## 2014-12-02 ENCOUNTER — Other Ambulatory Visit: Payer: Self-pay | Admitting: Obstetrics & Gynecology

## 2014-12-02 DIAGNOSIS — Z3A23 23 weeks gestation of pregnancy: Secondary | ICD-10-CM

## 2014-12-02 DIAGNOSIS — Z36 Encounter for antenatal screening of mother: Secondary | ICD-10-CM | POA: Insufficient documentation

## 2014-12-02 DIAGNOSIS — Z3490 Encounter for supervision of normal pregnancy, unspecified, unspecified trimester: Secondary | ICD-10-CM

## 2014-12-02 DIAGNOSIS — Z0489 Encounter for examination and observation for other specified reasons: Secondary | ICD-10-CM

## 2014-12-02 DIAGNOSIS — IMO0002 Reserved for concepts with insufficient information to code with codable children: Secondary | ICD-10-CM

## 2014-12-27 ENCOUNTER — Ambulatory Visit (INDEPENDENT_AMBULATORY_CARE_PROVIDER_SITE_OTHER): Payer: Medicaid Other | Admitting: Family Medicine

## 2014-12-27 ENCOUNTER — Encounter: Payer: Self-pay | Admitting: Family Medicine

## 2014-12-27 DIAGNOSIS — Z36 Encounter for antenatal screening of mother: Secondary | ICD-10-CM | POA: Diagnosis not present

## 2014-12-27 DIAGNOSIS — Z23 Encounter for immunization: Secondary | ICD-10-CM

## 2014-12-27 DIAGNOSIS — Z3482 Encounter for supervision of other normal pregnancy, second trimester: Secondary | ICD-10-CM

## 2014-12-27 DIAGNOSIS — Z3490 Encounter for supervision of normal pregnancy, unspecified, unspecified trimester: Secondary | ICD-10-CM

## 2014-12-27 LAB — CBC
HCT: 36 % (ref 36.0–46.0)
Hemoglobin: 11.9 g/dL — ABNORMAL LOW (ref 12.0–15.0)
MCH: 31.2 pg (ref 26.0–34.0)
MCHC: 33.1 g/dL (ref 30.0–36.0)
MCV: 94.5 fL (ref 78.0–100.0)
MPV: 9.4 fL (ref 8.6–12.4)
PLATELETS: 280 10*3/uL (ref 150–400)
RBC: 3.81 MIL/uL — AB (ref 3.87–5.11)
RDW: 13 % (ref 11.5–15.5)
WBC: 12.8 10*3/uL — ABNORMAL HIGH (ref 4.0–10.5)

## 2014-12-27 MED ORDER — TETANUS-DIPHTH-ACELL PERTUSSIS 5-2.5-18.5 LF-MCG/0.5 IM SUSP
0.5000 mL | Freq: Once | INTRAMUSCULAR | Status: AC
  Administered 2014-12-27: 0.5 mL via INTRAMUSCULAR

## 2014-12-27 NOTE — Patient Instructions (Signed)
Third Trimester of Pregnancy The third trimester is from week 29 through week 42, months 7 through 9. The third trimester is a time when the fetus is growing rapidly. At the end of the ninth month, the fetus is about 20 inches in length and weighs 6-10 pounds.  BODY CHANGES Your body goes through many changes during pregnancy. The changes vary from woman to woman.   Your weight will continue to increase. You can expect to gain 25-35 pounds (11-16 kg) by the end of the pregnancy.  You may begin to get stretch marks on your hips, abdomen, and breasts.  You may urinate more often because the fetus is moving lower into your pelvis and pressing on your bladder.  You may develop or continue to have heartburn as a result of your pregnancy.  You may develop constipation because certain hormones are causing the muscles that push waste through your intestines to slow down.  You may develop hemorrhoids or swollen, bulging veins (varicose veins).  You may have pelvic pain because of the weight gain and pregnancy hormones relaxing your joints between the bones in your pelvis. Backaches may result from overexertion of the muscles supporting your posture.  You may have changes in your hair. These can include thickening of your hair, rapid growth, and changes in texture. Some women also have hair loss during or after pregnancy, or hair that feels dry or thin. Your hair will most likely return to normal after your baby is born.  Your breasts will continue to grow and be tender. A yellow discharge may leak from your breasts called colostrum.  Your belly button may stick out.  You may feel short of breath because of your expanding uterus.  You may notice the fetus "dropping," or moving lower in your abdomen.  You may have a bloody mucus discharge. This usually occurs a few days to a week before labor begins.  Your cervix becomes thin and soft (effaced) near your due date. WHAT TO EXPECT AT YOUR  PRENATAL EXAMS  You will have prenatal exams every 2 weeks until week 36. Then, you will have weekly prenatal exams. During a routine prenatal visit:  You will be weighed to make sure you and the fetus are growing normally.  Your blood pressure is taken.  Your abdomen will be measured to track your baby's growth.  The fetal heartbeat will be listened to.  Any test results from the previous visit will be discussed.  You may have a cervical check near your due date to see if you have effaced. At around 36 weeks, your caregiver will check your cervix. At the same time, your caregiver will also perform a test on the secretions of the vaginal tissue. This test is to determine if a type of bacteria, Group B streptococcus, is present. Your caregiver will explain this further. Your caregiver may ask you:  What your birth plan is.  How you are feeling.  If you are feeling the baby move.  If you have had any abnormal symptoms, such as leaking fluid, bleeding, severe headaches, or abdominal cramping.  If you are using any tobacco products, including cigarettes, chewing tobacco, and electronic cigarettes.  If you have any questions. Other tests or screenings that may be performed during your third trimester include:  Blood tests that check for low iron levels (anemia).  Fetal testing to check the health, activity level, and growth of the fetus. Testing is done if you have certain medical conditions or if   there are problems during the pregnancy.  HIV (human immunodeficiency virus) testing. If you are at high risk, you may be screened for HIV during your third trimester of pregnancy. FALSE LABOR You may feel small, irregular contractions that eventually go away. These are called Braxton Hicks contractions, or false labor. Contractions may last for hours, days, or even weeks before true labor sets in. If contractions come at regular intervals, intensify, or become painful, it is best to be seen  by your caregiver.  SIGNS OF LABOR   Menstrual-like cramps.  Contractions that are 5 minutes apart or less.  Contractions that start on the top of the uterus and spread down to the lower abdomen and back.  A sense of increased pelvic pressure or back pain.  A watery or bloody mucus discharge that comes from the vagina. If you have any of these signs before the 37th week of pregnancy, call your caregiver right away. You need to go to the hospital to get checked immediately. HOME CARE INSTRUCTIONS   Avoid all smoking, herbs, alcohol, and unprescribed drugs. These chemicals affect the formation and growth of the baby.  Do not use any tobacco products, including cigarettes, chewing tobacco, and electronic cigarettes. If you need help quitting, ask your health care provider. You may receive counseling support and other resources to help you quit.  Follow your caregiver's instructions regarding medicine use. There are medicines that are either safe or unsafe to take during pregnancy.  Exercise only as directed by your caregiver. Experiencing uterine cramps is a good sign to stop exercising.  Continue to eat regular, healthy meals.  Wear a good support bra for breast tenderness.  Do not use hot tubs, steam rooms, or saunas.  Wear your seat belt at all times when driving.  Avoid raw meat, uncooked cheese, cat litter boxes, and soil used by cats. These carry germs that can cause birth defects in the baby.  Take your prenatal vitamins.  Take 1500-2000 mg of calcium daily starting at the 20th week of pregnancy until you deliver your baby.  Try taking a stool softener (if your caregiver approves) if you develop constipation. Eat more high-fiber foods, such as fresh vegetables or fruit and whole grains. Drink plenty of fluids to keep your urine clear or pale yellow.  Take warm sitz baths to soothe any pain or discomfort caused by hemorrhoids. Use hemorrhoid cream if your caregiver  approves.  If you develop varicose veins, wear support hose. Elevate your feet for 15 minutes, 3-4 times a day. Limit salt in your diet.  Avoid heavy lifting, wear low heal shoes, and practice good posture.  Rest a lot with your legs elevated if you have leg cramps or low back pain.  Visit your dentist if you have not gone during your pregnancy. Use a soft toothbrush to brush your teeth and be gentle when you floss.  A sexual relationship may be continued unless your caregiver directs you otherwise.  Do not travel far distances unless it is absolutely necessary and only with the approval of your caregiver.  Take prenatal classes to understand, practice, and ask questions about the labor and delivery.  Make a trial run to the hospital.  Pack your hospital bag.  Prepare the baby's nursery.  Continue to go to all your prenatal visits as directed by your caregiver. SEEK MEDICAL CARE IF:  You are unsure if you are in labor or if your water has broken.  You have dizziness.  You have   mild pelvic cramps, pelvic pressure, or nagging pain in your abdominal area.  You have persistent nausea, vomiting, or diarrhea.  You have a bad smelling vaginal discharge.  You have pain with urination. SEEK IMMEDIATE MEDICAL CARE IF:   You have a fever.  You are leaking fluid from your vagina.  You have spotting or bleeding from your vagina.  You have severe abdominal cramping or pain.  You have rapid weight loss or gain.  You have shortness of breath with chest pain.  You notice sudden or extreme swelling of your face, hands, ankles, feet, or legs.  You have not felt your baby move in over an hour.  You have severe headaches that do not go away with medicine.  You have vision changes.   This information is not intended to replace advice given to you by your health care provider. Make sure you discuss any questions you have with your health care provider.   Document Released:  01/26/2001 Document Revised: 02/22/2014 Document Reviewed: 04/04/2012 Elsevier Interactive Patient Education 2016 [employer]  Breastfeeding Deciding to breastfeed is one of the best choices you can make for you and your baby. A change in hormones during pregnancy causes your breast tissue to grow and increases the number and size of your milk ducts. These hormones also allow proteins, sugars, and fats from your blood supply to make breast milk in your milk-producing glands. Hormones prevent breast milk from being released before your baby is born as well as prompt milk flow after birth. Once breastfeeding has begun, thoughts of your baby, as well as his or her sucking or crying, can stimulate the release of milk from your milk-producing glands.  BENEFITS OF BREASTFEEDING For Your Baby  Your first milk (colostrum) helps your baby's digestive system function better.  There are antibodies in your milk that help your baby fight off infections.  Your baby has a lower incidence of asthma, allergies, and sudden infant death syndrome.  The nutrients in breast milk are better for your baby than infant formulas and are designed uniquely for your baby's needs.  Breast milk improves your baby's brain development.  Your baby is less likely to develop other conditions, such as childhood obesity, asthma, or type 2 diabetes mellitus. For You  Breastfeeding helps to create a very special bond between you and your baby.  Breastfeeding is convenient. Breast milk is always available at the correct temperature and costs nothing.  Breastfeeding helps to burn calories and helps you lose the weight gained during pregnancy.  Breastfeeding makes your uterus contract to its prepregnancy size faster and slows bleeding (lochia) after you give birth.   Breastfeeding helps to lower your risk of developing type 2 diabetes mellitus, osteoporosis, and breast or ovarian cancer later in life. SIGNS THAT YOUR BABY IS  HUNGRY Early Signs of Hunger  Increased alertness or activity.  Stretching.  Movement of the head from side to side.  Movement of the head and opening of the mouth when the corner of the mouth or cheek is stroked (rooting).  Increased sucking sounds, smacking lips, cooing, sighing, or squeaking.  Hand-to-mouth movements.  Increased sucking of fingers or hands. Late Signs of Hunger  Fussing.  Intermittent crying. Extreme Signs of Hunger Signs of extreme hunger will require calming and consoling before your baby will be able to breastfeed successfully. Do not wait for the following signs of extreme hunger to occur before you initiate breastfeeding:  Restlessness.  A loud, strong cry.  Screaming.   BREASTFEEDING BASICS Breastfeeding Initiation  Find a comfortable place to sit or lie down, with your neck and back well supported.  Place a pillow or rolled up blanket under your baby to bring him or her to the level of your breast (if you are seated). Nursing pillows are specially designed to help support your arms and your baby while you breastfeed.  Make sure that your baby's abdomen is facing your abdomen.  Gently massage your breast. With your fingertips, massage from your chest wall toward your nipple in a circular motion. This encourages milk flow. You may need to continue this action during the feeding if your milk flows slowly.  Support your breast with 4 fingers underneath and your thumb above your nipple. Make sure your fingers are well away from your nipple and your baby's mouth.  Stroke your baby's lips gently with your finger or nipple.  When your baby's mouth is open wide enough, quickly bring your baby to your breast, placing your entire nipple and as much of the colored area around your nipple (areola) as possible into your baby's mouth.  More areola should be visible above your baby's upper lip than below the lower lip.  Your baby's tongue should be between his  or her lower gum and your breast.  Ensure that your baby's mouth is correctly positioned around your nipple (latched). Your baby's lips should create a seal on your breast and be turned out (everted).  It is common for your baby to suck about 2-3 minutes in order to start the flow of breast milk. Latching Teaching your baby how to latch on to your breast properly is very important. An improper latch can cause nipple pain and decreased milk supply for you and poor weight gain in your baby. Also, if your baby is not latched onto your nipple properly, he or she may swallow some air during feeding. This can make your baby fussy. Burping your baby when you switch breasts during the feeding can help to get rid of the air. However, teaching your baby to latch on properly is still the best way to prevent fussiness from swallowing air while breastfeeding. Signs that your baby has successfully latched on to your nipple:  Silent tugging or silent sucking, without causing you pain.  Swallowing heard between every 3-4 sucks.  Muscle movement above and in front of his or her ears while sucking. Signs that your baby has not successfully latched on to nipple:  Sucking sounds or smacking sounds from your baby while breastfeeding.  Nipple pain. If you think your baby has not latched on correctly, slip your finger into the corner of your baby's mouth to break the suction and place it between your baby's gums. Attempt breastfeeding initiation again. Signs of Successful Breastfeeding Signs from your baby:  A gradual decrease in the number of sucks or complete cessation of sucking.  Falling asleep.  Relaxation of his or her body.  Retention of a small amount of milk in his or her mouth.  Letting go of your breast by himself or herself. Signs from you:  Breasts that have increased in firmness, weight, and size 1-3 hours after feeding.  Breasts that are softer immediately after  breastfeeding.  Increased milk volume, as well as a change in milk consistency and color by the fifth day of breastfeeding.  Nipples that are not sore, cracked, or bleeding. Signs That Your Baby is Getting Enough Milk  Wetting at least 3 diapers in a 24-hour period.   The urine should be clear and pale yellow by age 5 days.  At least 3 stools in a 24-hour period by age 5 days. The stool should be soft and yellow.  At least 3 stools in a 24-hour period by age 7 days. The stool should be seedy and yellow.  No loss of weight greater than 10% of birth weight during the first 3 days of age.  Average weight gain of 4-7 ounces (113-198 g) per week after age 4 days.  Consistent daily weight gain by age 5 days, without weight loss after the age of 2 weeks. After a feeding, your baby may spit up a small amount. This is common. BREASTFEEDING FREQUENCY AND DURATION Frequent feeding will help you make more milk and can prevent sore nipples and breast engorgement. Breastfeed when you feel the need to reduce the fullness of your breasts or when your baby shows signs of hunger. This is called "breastfeeding on demand." Avoid introducing a pacifier to your baby while you are working to establish breastfeeding (the first 4-6 weeks after your baby is born). After this time you may choose to use a pacifier. Research has shown that pacifier use during the first year of a baby's life decreases the risk of sudden infant death syndrome (SIDS). Allow your baby to feed on each breast as long as he or she wants. Breastfeed until your baby is finished feeding. When your baby unlatches or falls asleep while feeding from the first breast, offer the second breast. Because newborns are often sleepy in the first few weeks of life, you may need to awaken your baby to get him or her to feed. Breastfeeding times will vary from baby to baby. However, the following rules can serve as a guide to help you ensure that your baby is  properly fed:  Newborns (babies 4 weeks of age or younger) may breastfeed every 1-3 hours.  Newborns should not go longer than 3 hours during the day or 5 hours during the night without breastfeeding.  You should breastfeed your baby a minimum of 8 times in a 24-hour period until you begin to introduce solid foods to your baby at around 6 months of age. BREAST MILK PUMPING Pumping and storing breast milk allows you to ensure that your baby is exclusively fed your breast milk, even at times when you are unable to breastfeed. This is especially important if you are going back to work while you are still breastfeeding or when you are not able to be present during feedings. Your lactation consultant can give you guidelines on how long it is safe to store breast milk. A breast pump is a machine that allows you to pump milk from your breast into a sterile bottle. The pumped breast milk can then be stored in a refrigerator or freezer. Some breast pumps are operated by hand, while others use electricity. Ask your lactation consultant which type will work best for you. Breast pumps can be purchased, but some hospitals and breastfeeding support groups lease breast pumps on a monthly basis. A lactation consultant can teach you how to hand express breast milk, if you prefer not to use a pump. CARING FOR YOUR BREASTS WHILE YOU BREASTFEED Nipples can become dry, cracked, and sore while breastfeeding. The following recommendations can help keep your breasts moisturized and healthy:  Avoid using soap on your nipples.  Wear a supportive bra. Although not required, special nursing bras and tank tops are designed to allow access to your   breasts for breastfeeding without taking off your entire bra or top. Avoid wearing underwire-style bras or extremely tight bras.  Air dry your nipples for 3-4minutes after each feeding.  Use only cotton bra pads to absorb leaked breast milk. Leaking of breast milk between feedings  is normal.  Use lanolin on your nipples after breastfeeding. Lanolin helps to maintain your skin's normal moisture barrier. If you use pure lanolin, you do not need to wash it off before feeding your baby again. Pure lanolin is not toxic to your baby. You may also hand express a few drops of breast milk and gently massage that milk into your nipples and allow the milk to air dry. In the first few weeks after giving birth, some women experience extremely full breasts (engorgement). Engorgement can make your breasts feel heavy, warm, and tender to the touch. Engorgement peaks within 3-5 days after you give birth. The following recommendations can help ease engorgement:  Completely empty your breasts while breastfeeding or pumping. You may want to start by applying warm, moist heat (in the shower or with warm water-soaked hand towels) just before feeding or pumping. This increases circulation and helps the milk flow. If your baby does not completely empty your breasts while breastfeeding, pump any extra milk after he or she is finished.  Wear a snug bra (nursing or regular) or tank top for 1-2 days to signal your body to slightly decrease milk production.  Apply ice packs to your breasts, unless this is too uncomfortable for you.  Make sure that your baby is latched on and positioned properly while breastfeeding. If engorgement persists after 48 hours of following these recommendations, contact your health care provider or a lactation consultant. OVERALL HEALTH CARE RECOMMENDATIONS WHILE BREASTFEEDING  Eat healthy foods. Alternate between meals and snacks, eating 3 of each per day. Because what you eat affects your breast milk, some of the foods may make your baby more irritable than usual. Avoid eating these foods if you are sure that they are negatively affecting your baby.  Drink milk, fruit juice, and water to satisfy your thirst (about 10 glasses a day).  Rest often, relax, and continue to take  your prenatal vitamins to prevent fatigue, stress, and anemia.  Continue breast self-awareness checks.  Avoid chewing and smoking tobacco. Chemicals from cigarettes that pass into breast milk and exposure to secondhand smoke may harm your baby.  Avoid alcohol and drug use, including marijuana. Some medicines that may be harmful to your baby can pass through breast milk. It is important to ask your health care provider before taking any medicine, including all over-the-counter and prescription medicine as well as vitamin and herbal supplements. It is possible to become pregnant while breastfeeding. If birth control is desired, ask your health care provider about options that will be safe for your baby. SEEK MEDICAL CARE IF:  You feel like you want to stop breastfeeding or have become frustrated with breastfeeding.  You have painful breasts or nipples.  Your nipples are cracked or bleeding.  Your breasts are red, tender, or warm.  You have a swollen area on either breast.  You have a fever or chills.  You have nausea or vomiting.  You have drainage other than breast milk from your nipples.  Your breasts do not become full before feedings by the fifth day after you give birth.  You feel sad and depressed.  Your baby is too sleepy to eat well.  Your baby is having trouble sleeping.     Your baby is wetting less than 3 diapers in a 24-hour period.  Your baby has less than 3 stools in a 24-hour period.  Your baby's skin or the white part of his or her eyes becomes yellow.   Your baby is not gaining weight by 5 days of age. SEEK IMMEDIATE MEDICAL CARE IF:  Your baby is overly tired (lethargic) and does not want to wake up and feed.  Your baby develops an unexplained fever.   This information is not intended to replace advice given to you by your health care provider. Make sure you discuss any questions you have with your health care provider.   Document Released: 02/01/2005  Document Revised: 10/23/2014 Document Reviewed: 07/26/2012 [employer] Interactive Patient Education 2016 [employer]  

## 2014-12-27 NOTE — Progress Notes (Signed)
Subjective:  Maria Pugh is a 29 y.o. G2P1001 at 7880w2d being seen today for ongoing prenatal care.  Patient reports no complaints.  Contractions: Irregular.  Vag. Bleeding: None. Movement: Present. Denies leaking of fluid.   The following portions of the patient's history were reviewed and updated as appropriate: allergies, current medications, past family history, past medical history, past social history, past surgical history and problem list. Problem list updated.  Objective:   Filed Vitals:   12/27/14 0825  BP: 118/77  Pulse: 109  Weight: 156 lb (70.761 kg)    Fetal Status: Fetal Heart Rate (bpm): 153 Fundal Height: 27 cm Movement: Present     General:  Alert, oriented and cooperative. Patient is in no acute distress.  Skin: Skin is warm and dry. No rash noted.   Cardiovascular: Normal heart rate noted  Respiratory: Normal respiratory effort, no problems with respiration noted  Abdomen: Soft, gravid, appropriate for gestational age. Pain/Pressure: Present     Pelvic: Vag. Bleeding: None Vag D/C Character: Thin   Cervical exam deferred        Extremities: Normal range of motion.  Edema: None  Mental Status: Normal mood and affect. Normal behavior. Normal judgment and thought content.   Urinalysis: Urine Protein: Negative Urine Glucose: Negative  Assessment and Plan:  Pregnancy: G2P1001 at 6180w2d  1. Normal pregnancy, antepartum 28 wk labs today U/S reviewed--previa is resolved. - CBC - RPR - HIV antibody - Glucose Tolerance, 1 HR (50g) - Tdap (BOOSTRIX) injection 0.5 mL; Inject 0.5 mLs into the muscle once.  Preterm labor symptoms and general obstetric precautions including but not limited to vaginal bleeding, contractions, leaking of fluid and fetal movement were reviewed in detail with the patient. Please refer to After Visit Summary for other counseling recommendations.  Return in 2 weeks (on 01/10/2015).   Reva Boresanya S Sharmeka Palmisano, MD

## 2014-12-28 LAB — HIV ANTIBODY (ROUTINE TESTING W REFLEX): HIV 1&2 Ab, 4th Generation: NONREACTIVE

## 2014-12-28 LAB — GLUCOSE TOLERANCE, 1 HOUR (50G) W/O FASTING: Glucose, 1 Hour GTT: 125 mg/dL (ref 70–140)

## 2014-12-28 LAB — RPR

## 2015-01-08 ENCOUNTER — Ambulatory Visit (INDEPENDENT_AMBULATORY_CARE_PROVIDER_SITE_OTHER): Payer: Medicaid Other | Admitting: Advanced Practice Midwife

## 2015-01-08 VITALS — BP 113/72 | HR 99 | Wt 154.0 lb

## 2015-01-08 DIAGNOSIS — Z3483 Encounter for supervision of other normal pregnancy, third trimester: Secondary | ICD-10-CM

## 2015-01-08 DIAGNOSIS — O9989 Other specified diseases and conditions complicating pregnancy, childbirth and the puerperium: Secondary | ICD-10-CM

## 2015-01-08 DIAGNOSIS — R109 Unspecified abdominal pain: Secondary | ICD-10-CM

## 2015-01-08 DIAGNOSIS — O26899 Other specified pregnancy related conditions, unspecified trimester: Secondary | ICD-10-CM

## 2015-01-08 NOTE — Progress Notes (Signed)
Subjective:  Maria Pugh is a 29 y.o. G2P1001 at 8226w0d being seen today for ongoing prenatal care.  She is currently monitored for the following issues for this low-risk pregnancy and has Normal pregnancy, antepartum; History of abnormal cervical Pap smear; and UTI in pregnancy on her problem list.  Patient reports intermittent cramping, sharp pain radiating into vagina mostly when walking.  Contractions: Irregular. Vag. Bleeding: None.  Movement: Present. Denies leaking of fluid.   The following portions of the patient's history were reviewed and updated as appropriate: allergies, current medications, past family history, past medical history, past social history, past surgical history and problem list. Problem list updated.  Objective:   Filed Vitals:   01/08/15 1402  BP: 113/72  Pulse: 99  Weight: 154 lb (69.854 kg)    Fetal Status: Fetal Heart Rate (bpm): 145 Fundal Height: 29 cm Movement: Present     General:  Alert, oriented and cooperative. Patient is in no acute distress.  Skin: Skin is warm and dry. No rash noted.   Cardiovascular: Normal heart rate noted  Respiratory: Normal respiratory effort, no problems with respiration noted  Abdomen: Soft, gravid, appropriate for gestational age. Pain/Pressure: Present     Pelvic: Vag. Bleeding: None Vag D/C Character: Thin   Cervical exam performed Dilation: Closed Effacement (%): Thick Station: Ballotable  Extremities: Normal range of motion.  Edema: None  Mental Status: Normal mood and affect. Normal behavior. Normal judgment and thought content.   Urinalysis: Urine Protein: Negative Urine Glucose: Negative  Assessment and Plan:  Pregnancy: G2P1001 at 9326w0d 1. Abdominal pain affecting pregnancy, antepartum --Cervix closed, thick, high.  Recommend increased PO fluids, rest/ice/heat/Tylenol for pain.   Preterm labor symptoms and general obstetric precautions including but not limited to vaginal bleeding, contractions, leaking  of fluid and fetal movement were reviewed in detail with the patient. Please refer to After Visit Summary for other counseling recommendations.  Return in about 2 weeks (around 01/22/2015).   Hurshel PartyLisa A Leftwich-Kirby, CNM

## 2015-01-08 NOTE — Progress Notes (Signed)
Pt c/o sudden sharp pains on and off at her lower mid pelvic region.

## 2015-01-22 ENCOUNTER — Ambulatory Visit (INDEPENDENT_AMBULATORY_CARE_PROVIDER_SITE_OTHER): Payer: Medicaid Other | Admitting: Obstetrics & Gynecology

## 2015-01-22 VITALS — BP 110/76 | HR 98 | Wt 157.0 lb

## 2015-01-22 DIAGNOSIS — Z3483 Encounter for supervision of other normal pregnancy, third trimester: Secondary | ICD-10-CM

## 2015-01-22 DIAGNOSIS — Z3490 Encounter for supervision of normal pregnancy, unspecified, unspecified trimester: Secondary | ICD-10-CM

## 2015-01-22 NOTE — Patient Instructions (Signed)
Return to clinic for any obstetric concerns or go to [facility] for evaluation  

## 2015-01-22 NOTE — Progress Notes (Signed)
Subjective:  Maria Pugh is a 29 y.o. G2P1001 at 9569w0d being seen today for ongoing prenatal care.  She is currently monitored for the following issues for this low-risk pregnancy and has Normal pregnancy, antepartum and History of abnormal cervical Pap smear on her problem list.  Patient reports no complaints.  Contractions: Not present. Vag. Bleeding: None.  Movement: Present. Denies leaking of fluid.   The following portions of the patient's history were reviewed and updated as appropriate: allergies, current medications, past family history, past medical history, past social history, past surgical history and problem list. Problem list updated.  Objective:   Filed Vitals:   01/22/15 1036  BP: 110/76  Pulse: 98  Weight: 157 lb (71.215 kg)    Fetal Status: Fetal Heart Rate (bpm): 150 Fundal Height: 31 cm Movement: Present     General:  Alert, oriented and cooperative. Patient is in no acute distress.  Skin: Skin is warm and dry. No rash noted.   Cardiovascular: Normal heart rate noted  Respiratory: Normal respiratory effort, no problems with respiration noted  Abdomen: Soft, gravid, appropriate for gestational age. Pain/Pressure: Present     Pelvic: Vag. Bleeding: None Vag D/C Character: Thin  Cervical exam deferred        Extremities: Normal range of motion.  Edema: None  Mental Status: Normal mood and affect. Normal behavior. Normal judgment and thought content.   Urinalysis: Urine Protein: Negative Urine Glucose: Negative  Assessment and Plan:  Pregnancy: G2P1001 at 6269w0d  Normal pregnancy, antepartum Preterm labor symptoms and general obstetric precautions including but not limited to vaginal bleeding, contractions, leaking of fluid and fetal movement were reviewed in detail with the patient. Please refer to After Visit Summary for other counseling recommendations.  Return in about 2 weeks (around 02/05/2015).   Tereso NewcomerUgonna A Jihan Mellette, MD

## 2015-02-05 ENCOUNTER — Ambulatory Visit (INDEPENDENT_AMBULATORY_CARE_PROVIDER_SITE_OTHER): Payer: Medicaid Other | Admitting: Obstetrics & Gynecology

## 2015-02-05 VITALS — BP 108/71 | HR 96 | Wt 157.0 lb

## 2015-02-05 DIAGNOSIS — Z3483 Encounter for supervision of other normal pregnancy, third trimester: Secondary | ICD-10-CM

## 2015-02-05 DIAGNOSIS — Z3490 Encounter for supervision of normal pregnancy, unspecified, unspecified trimester: Secondary | ICD-10-CM

## 2015-02-05 NOTE — Progress Notes (Signed)
Subjective:  Maria Pugh is a 29 y.o. 252P1001 (27 yo son)  at 2241w0d being seen today for ongoing prenatal care.  She is currently monitored for the following issues for this low-risk pregnancy and has Normal pregnancy, antepartum and History of abnormal cervical Pap smear on her problem list.  Patient reports no complaints.  Contractions: Irregular. Vag. Bleeding: None.  Movement: Present. Denies leaking of fluid.   The following portions of the patient's history were reviewed and updated as appropriate: allergies, current medications, past family history, past medical history, past social history, past surgical history and problem list. Problem list updated.  Objective:   Filed Vitals:   02/05/15 1038  BP: 108/71  Pulse: 96  Weight: 157 lb (71.215 kg)    Fetal Status: Fetal Heart Rate (bpm): 140   Movement: Present     General:  Alert, oriented and cooperative. Patient is in no acute distress.  Skin: Skin is warm and dry. No rash noted.   Cardiovascular: Normal heart rate noted  Respiratory: Normal respiratory effort, no problems with respiration noted  Abdomen: Soft, gravid, appropriate for gestational age. Pain/Pressure: Present     Pelvic: Vag. Bleeding: None Vag D/C Character: Thin   Cervical exam deferred        Extremities: Normal range of motion.  Edema: None  Mental Status: Normal mood and affect. Normal behavior. Normal judgment and thought content.   Urinalysis:      Assessment and Plan:  Pregnancy: G2P1001 at 7541w0d  1. Normal pregnancy, antepartum   Preterm labor symptoms and general obstetric precautions including but not limited to vaginal bleeding, contractions, leaking of fluid and fetal movement were reviewed in detail with the patient. Please refer to After Visit Summary for other counseling recommendations.  Return in about 2 weeks (around 02/19/2015).   Allie BossierMyra C Mellie Buccellato, MD

## 2015-02-16 NOTE — L&D Delivery Note (Signed)
Delivery Note  Patient presented in active labor and proceeded in short order to complete. She pushed for approximately 30 minutes. One prolonged late decel for approximately 90 seconds; otherwise category 1 strip during the second stage.  At 12:28 PM a viable female was delivered via Vaginal, Spontaneous Delivery (Presentation: Left Occiput Anterior).  APGAR: 8, 9; weight 3185 g.   Placenta status: intact  Cord: 3 vessel.  with the following complications: none.  Cord pH: not obtained.  Anesthesia: Epidural  Episiotomy: None Lacerations: None Est. Blood Loss (mL):  250  Mom to postpartum.  Baby to Couplet care / Skin to Skin.  Cherrie Gauze Kolton Kienle 03/15/2015, 12:45 PM

## 2015-02-19 ENCOUNTER — Ambulatory Visit (INDEPENDENT_AMBULATORY_CARE_PROVIDER_SITE_OTHER): Payer: Medicaid Other | Admitting: Obstetrics and Gynecology

## 2015-02-19 ENCOUNTER — Encounter: Payer: Self-pay | Admitting: Obstetrics and Gynecology

## 2015-02-19 VITALS — BP 114/78 | HR 106 | Wt 160.0 lb

## 2015-02-19 DIAGNOSIS — Z3490 Encounter for supervision of normal pregnancy, unspecified, unspecified trimester: Secondary | ICD-10-CM

## 2015-02-19 DIAGNOSIS — Z3483 Encounter for supervision of other normal pregnancy, third trimester: Secondary | ICD-10-CM

## 2015-02-19 NOTE — Progress Notes (Signed)
Subjective:  Maria Pugh is a 30 y.o. G2P1001 at 2290w0d being seen today for ongoing prenatal care.  She is currently monitored for the following issues for this low-risk pregnancy and has Normal pregnancy, antepartum and History of abnormal cervical Pap smear on her problem list.  Patient reports no complaints.  Contractions: Not present. Vag. Bleeding: None.  Movement: Present. Denies leaking of fluid.   The following portions of the patient's history were reviewed and updated as appropriate: allergies, current medications, past family history, past medical history, past social history, past surgical history and problem list. Problem list updated.  Objective:   Filed Vitals:   02/19/15 0924  BP: 114/78  Pulse: 106  Weight: 160 lb (72.576 kg)    Fetal Status: Fetal Heart Rate (bpm): 142   Movement: Present     General:  Alert, oriented and cooperative. Patient is in no acute distress.  Skin: Skin is warm and dry. No rash noted.   Cardiovascular: Normal heart rate noted  Respiratory: Normal respiratory effort, no problems with respiration noted  Abdomen: Soft, gravid, appropriate for gestational age. Pain/Pressure: Present     Pelvic: Vag. Bleeding: None Vag D/C Character: Thin   Cervical exam deferred        Extremities: Normal range of motion.  Edema: None  Mental Status: Normal mood and affect. Normal behavior. Normal judgment and thought content.   Urinalysis: Urine Protein: Trace Urine Glucose: Negative  Assessment and Plan:  Pregnancy: G2P1001 at 5390w0d  1. Normal pregnancy, antepartum Cultures next visit  Preterm labor symptoms and general obstetric precautions including but not limited to vaginal bleeding, contractions, leaking of fluid and fetal movement were reviewed in detail with the patient. Please refer to After Visit Summary for other counseling recommendations.  Return in about 1 week (around 02/26/2015).   Catalina AntiguaPeggy Crystale Giannattasio, MD

## 2015-02-26 ENCOUNTER — Other Ambulatory Visit (HOSPITAL_COMMUNITY)
Admission: RE | Admit: 2015-02-26 | Discharge: 2015-02-26 | Disposition: A | Discharge status: Home / Self Care | Payer: Medicaid Other | Source: Ambulatory Visit | Attending: Certified Nurse Midwife | Admitting: Certified Nurse Midwife

## 2015-02-26 ENCOUNTER — Ambulatory Visit (INDEPENDENT_AMBULATORY_CARE_PROVIDER_SITE_OTHER): Payer: Medicaid Other | Admitting: Certified Nurse Midwife

## 2015-02-26 VITALS — BP 117/85 | HR 108 | Wt 166.0 lb

## 2015-02-26 DIAGNOSIS — Z3483 Encounter for supervision of other normal pregnancy, third trimester: Secondary | ICD-10-CM

## 2015-02-26 DIAGNOSIS — Z3490 Encounter for supervision of normal pregnancy, unspecified, unspecified trimester: Secondary | ICD-10-CM

## 2015-02-26 DIAGNOSIS — Z36 Encounter for antenatal screening of mother: Secondary | ICD-10-CM | POA: Diagnosis not present

## 2015-02-26 DIAGNOSIS — Z113 Encounter for screening for infections with a predominantly sexual mode of transmission: Secondary | ICD-10-CM | POA: Insufficient documentation

## 2015-02-26 NOTE — Patient Instructions (Signed)

## 2015-02-26 NOTE — Progress Notes (Signed)
Subjective:  Maria Pugh is a 30 y.o. G2P1001 at 643w0d being seen today for ongoing prenatal care.  She is currently monitored for the following issues for this low-risk pregnancy and has Normal pregnancy, antepartum and History of abnormal cervical Pap smear on her problem list.  Patient reports no complaints.  Contractions: Not present. Vag. Bleeding: None.  Movement: Present. Denies leaking of fluid.   The following portions of the patient's history were reviewed and updated as appropriate: allergies, current medications, past family history, past medical history, past social history, past surgical history and problem list. Problem list updated.  Objective:   Filed Vitals:   02/26/15 1341  BP: 117/85  Pulse: 108  Weight: 166 lb (75.297 kg)    Fetal Status: Fetal Heart Rate (bpm): 143   Movement: Present     General:  Alert, oriented and cooperative. Patient is in no acute distress.  Skin: Skin is warm and dry. No rash noted.   Cardiovascular: Normal heart rate noted  Respiratory: Normal respiratory effort, no problems with respiration noted  Abdomen: Soft, gravid, appropriate for gestational age. Pain/Pressure: Present     Pelvic: Vag. Bleeding: None Vag D/C Character: Thin   Cervical exam performed        Extremities: Normal range of motion.  Edema: Trace  Mental Status: Normal mood and affect. Normal behavior. Normal judgment and thought content.   Urinalysis:      Assessment and Plan:  Pregnancy: G2P1001 at 713w0d  1. Normal pregnancy, antepartum  - Urine cytology ancillary only - Culture, beta strep (group b only)  Preterm labor symptoms and general obstetric precautions including but not limited to vaginal bleeding, contractions, leaking of fluid and fetal movement were reviewed in detail with the patient. Please refer to After Visit Summary for other counseling recommendations.  No Follow-up on file.   Rhea PinkLori A Clemmons, CNM

## 2015-02-26 NOTE — Progress Notes (Signed)
Subjective:  Maria Pugh is a 30 y.o. G2P1001 at 3958w0d being seen today for ongoing prenatal care.  She is currently monitored for the following issues for this low-risk pregnancy and has Normal pregnancy, antepartum and History of abnormal cervical Pap smear on her problem list.  Patient reports no complaints.  Contractions: Not present. Vag. Bleeding: None.  Movement: Present. Denies leaking of fluid.   The following portions of the patient's history were reviewed and updated as appropriate: allergies, current medications, past family history, past medical history, past social history, past surgical history and problem list. Problem list updated.  Objective:   Filed Vitals:   02/26/15 1341  BP: 117/85  Pulse: 108  Weight: 166 lb (75.297 kg)    Fetal Status: Fetal Heart Rate (bpm): 143 Fundal Height: 37 cm Movement: Present     General:  Alert, oriented and cooperative. Patient is in no acute distress.  Skin: Skin is warm and dry. No rash noted.   Cardiovascular: Normal heart rate noted  Respiratory: Normal respiratory effort, no problems with respiration noted  Abdomen: Soft, gravid, appropriate for gestational age. Pain/Pressure: Present     Pelvic: Vag. Bleeding: None Vag D/C Character: Thin   Cervical exam performed Dilation: 3 Effacement (%): Thick Station: -2  Extremities: Normal range of motion.  Edema: Trace  Mental Status: Normal mood and affect. Normal behavior. Normal judgment and thought content.   Urinalysis:      Assessment and Plan:  Pregnancy: G2P1001 at 7458w0d  1. Normal pregnancy, antepartum  - Urine cytology ancillary only - Culture, beta strep (group b only)  Preterm labor symptoms and general obstetric precautions including but not limited to vaginal bleeding, contractions, leaking of fluid and fetal movement were reviewed in detail with the patient. Please refer to After Visit Summary for other counseling recommendations.  Return in about 1 week  (around 03/05/2015).   Rhea PinkLori A Clemmons, CNM

## 2015-02-27 LAB — URINE CYTOLOGY ANCILLARY ONLY
Chlamydia: NEGATIVE
Neisseria Gonorrhea: NEGATIVE

## 2015-02-28 LAB — CULTURE, BETA STREP (GROUP B ONLY)

## 2015-03-05 ENCOUNTER — Ambulatory Visit (INDEPENDENT_AMBULATORY_CARE_PROVIDER_SITE_OTHER): Payer: Medicaid Other | Admitting: Certified Nurse Midwife

## 2015-03-05 VITALS — BP 123/80 | HR 114 | Wt 165.0 lb

## 2015-03-05 DIAGNOSIS — Z3490 Encounter for supervision of normal pregnancy, unspecified, unspecified trimester: Secondary | ICD-10-CM

## 2015-03-05 DIAGNOSIS — Z3483 Encounter for supervision of other normal pregnancy, third trimester: Secondary | ICD-10-CM

## 2015-03-05 NOTE — Progress Notes (Signed)
Subjective:  Maria Pugh is a 30 y.o. G2P1001 at [redacted]w[redacted]d being seen today for ongoing prenatal care.  She is currently monitored for the following issues for this low-risk pregnancy and has Normal pregnancy, antepartum and History of abnormal cervical Pap smear on her problem list.  Patient reports no complaints.  Contractions: Not present.  .  Movement: Present. Denies leaking of fluid.   The following portions of the patient's history were reviewed and updated as appropriate: allergies, current medications, past family history, past medical history, past social history, past surgical history and problem list. Problem list updated.  Objective:   Filed Vitals:   03/05/15 1447  BP: 123/80  Pulse: 114  Weight: 165 lb (74.844 kg)    Fetal Status: Fetal Heart Rate (bpm): 158   Movement: Present     General:  Alert, oriented and cooperative. Patient is in no acute distress.  Skin: Skin is warm and dry. No rash noted.   Cardiovascular: Normal heart rate noted  Respiratory: Normal respiratory effort, no problems with respiration noted  Abdomen: Soft, gravid, appropriate for gestational age. Pain/Pressure: Present     Pelvic:       Cervical exam deferred        Extremities: Normal range of motion.  Edema: Trace  Mental Status: Normal mood and affect. Normal behavior. Normal judgment and thought content.   Urinalysis: Urine Protein: Trace Urine Glucose: Negative  Assessment and Plan:  Pregnancy: G2P1001 at [redacted]w[redacted]d  1. Normal pregnancy, antepartum  Term labor symptoms and general obstetric precautions including but not limited to vaginal bleeding, contractions, leaking of fluid and fetal movement were reviewed in detail with the patient. Please refer to After Visit Summary for other counseling recommendations.  Return in about 1 week (around 03/12/2015).   Rhea Pink, CNM

## 2015-03-05 NOTE — Patient Instructions (Signed)
Low-Fat Diet for Pancreatitis or Gallbladder Conditions °A low-fat diet can be helpful if you have pancreatitis or a gallbladder condition. With these conditions, your pancreas and gallbladder have trouble digesting fats. A healthy eating plan with less fat will help rest your pancreas and gallbladder and reduce your symptoms. °WHAT DO I NEED TO KNOW ABOUT THIS DIET? °· Eat a low-fat diet. °¨ Reduce your fat intake to less than 20-30% of your total daily calories. This is less than 50-60 g of fat per day. °¨ Remember that you need some fat in your diet. Ask your dietician what your daily goal should be. °¨ Choose nonfat and low-fat healthy foods. Look for the words "nonfat," "low fat," or "fat free." °¨ As a guide, look on the label and choose foods with less than 3 g of fat per serving. Eat only one serving. °· Avoid alcohol. °· Do not smoke. If you need help quitting, talk with your health care provider. °· Eat small frequent meals instead of three large heavy meals. °WHAT FOODS CAN I EAT? °Grains °Include healthy grains and starches such as potatoes, wheat bread, fiber-rich cereal, and brown rice. Choose whole grain options whenever possible. In adults, whole grains should account for 45-65% of your daily calories.  °Fruits and Vegetables °Eat plenty of fruits and vegetables. Fresh fruits and vegetables add fiber to your diet. °Meats and Other Protein Sources °Eat lean meat such as chicken and pork. Trim any fat off of meat before cooking it. Eggs, fish, and beans are other sources of protein. In adults, these foods should account for 10-35% of your daily calories. °Dairy °Choose low-fat milk and dairy options. Dairy includes fat and protein, as well as calcium.  °Fats and Oils °Limit high-fat foods such as fried foods, sweets, baked goods, sugary drinks.  °Other °Creamy sauces and condiments, such as mayonnaise, can add extra fat. Think about whether or not you need to use them, or use smaller amounts or low fat  options. °WHAT FOODS ARE NOT RECOMMENDED? °· High fat foods, such as: °¨ Baked goods. °¨ Ice cream. °¨ French toast. °¨ Sweet rolls. °¨ Pizza. °¨ Cheese bread. °¨ Foods covered with batter, butter, creamy sauces, or cheese. °¨ Fried foods. °¨ Sugary drinks and desserts. °· Foods that cause gas or bloating °  °This information is not intended to replace advice given to you by your health care provider. Make sure you discuss any questions you have with your health care provider. °  °Document Released: 02/06/2013 Document Reviewed: 02/06/2013 °Elsevier Interactive Patient Education ©2016 [employer] ° °

## 2015-03-05 NOTE — Progress Notes (Signed)
Pt c/o pain in the upper right part of her abdomen that gets worse when she eats and causes her to vomit.

## 2015-03-12 ENCOUNTER — Ambulatory Visit (INDEPENDENT_AMBULATORY_CARE_PROVIDER_SITE_OTHER): Payer: Medicaid Other | Admitting: Certified Nurse Midwife

## 2015-03-12 VITALS — BP 115/81 | HR 120 | Wt 167.0 lb

## 2015-03-12 DIAGNOSIS — Z3483 Encounter for supervision of other normal pregnancy, third trimester: Secondary | ICD-10-CM

## 2015-03-12 DIAGNOSIS — Z3490 Encounter for supervision of normal pregnancy, unspecified, unspecified trimester: Secondary | ICD-10-CM

## 2015-03-12 NOTE — Progress Notes (Signed)
\    Subjective:  Maria Pugh is a 30 y.o. G2P1001 at [redacted]w[redacted]d being seen today for ongoing prenatal care.  She is currently monitored for the following issues for this low-risk pregnancy and has Normal pregnancy, antepartum and History of abnormal cervical Pap smear on her problem list.  Patient reports no complaints.  Contractions: Not present. Vag. Bleeding: None.  Movement: Present. Denies leaking of fluid.   The following portions of the patient's history were reviewed and updated as appropriate: allergies, current medications, past family history, past medical history, past social history, past surgical history and problem list. Problem list updated.  Objective:   Filed Vitals:   03/12/15 1505  BP: 115/81  Pulse: 120  Weight: 167 lb (75.751 kg)    Fetal Status: Fetal Heart Rate (bpm): 153 Fundal Height: 37 cm Movement: Present     General:  Alert, oriented and cooperative. Patient is in no acute distress.  Skin: Skin is warm and dry. No rash noted.   Cardiovascular: Normal heart rate noted  Respiratory: Normal respiratory effort, no problems with respiration noted  Abdomen: Soft, gravid, appropriate for gestational age. Pain/Pressure: Present     Pelvic: Vag. Bleeding: None Vag D/C Character: Thin   Cervical exam deferred        Extremities: Normal range of motion.  Edema: Trace  Mental Status: Normal mood and affect. Normal behavior. Normal judgment and thought content.   Urinalysis: Urine Protein: Trace Urine Glucose: 1+  Assessment and Plan:  Pregnancy: G2P1001 at [redacted]w[redacted]d  1. Normal pregnancy, antepartum   Term labor symptoms and general obstetric precautions including but not limited to vaginal bleeding, contractions, leaking of fluid and fetal movement were reviewed in detail with the patient. Please refer to After Visit Summary for other counseling recommendations.  Return in about 1 week (around 03/19/2015).   Rhea Pink, CNM

## 2015-03-12 NOTE — Patient Instructions (Signed)

## 2015-03-15 ENCOUNTER — Inpatient Hospital Stay (HOSPITAL_COMMUNITY): Payer: Medicaid Other | Admitting: Anesthesiology

## 2015-03-15 ENCOUNTER — Encounter (HOSPITAL_COMMUNITY): Payer: Self-pay | Admitting: *Deleted

## 2015-03-15 ENCOUNTER — Inpatient Hospital Stay (HOSPITAL_COMMUNITY)
Admission: AD | Admit: 2015-03-15 | Discharge: 2015-03-16 | DRG: 775 | Disposition: A | Discharge status: Home / Self Care | Payer: Medicaid Other | Source: Ambulatory Visit | Attending: Obstetrics & Gynecology | Admitting: Obstetrics & Gynecology

## 2015-03-15 DIAGNOSIS — Z3A38 38 weeks gestation of pregnancy: Secondary | ICD-10-CM

## 2015-03-15 DIAGNOSIS — Z833 Family history of diabetes mellitus: Secondary | ICD-10-CM

## 2015-03-15 DIAGNOSIS — IMO0001 Reserved for inherently not codable concepts without codable children: Secondary | ICD-10-CM

## 2015-03-15 DIAGNOSIS — Z3490 Encounter for supervision of normal pregnancy, unspecified, unspecified trimester: Secondary | ICD-10-CM

## 2015-03-15 DIAGNOSIS — O4292 Full-term premature rupture of membranes, unspecified as to length of time between rupture and onset of labor: Secondary | ICD-10-CM

## 2015-03-15 LAB — TYPE AND SCREEN
ABO/RH(D): B POS
Antibody Screen: NEGATIVE

## 2015-03-15 LAB — CBC
HCT: 36.4 % (ref 36.0–46.0)
Hemoglobin: 12.3 g/dL (ref 12.0–15.0)
MCH: 29.8 pg (ref 26.0–34.0)
MCHC: 33.8 g/dL (ref 30.0–36.0)
MCV: 88.1 fL (ref 78.0–100.0)
PLATELETS: 263 10*3/uL (ref 150–400)
RBC: 4.13 MIL/uL (ref 3.87–5.11)
RDW: 14 % (ref 11.5–15.5)
WBC: 13.5 10*3/uL — AB (ref 4.0–10.5)

## 2015-03-15 LAB — POCT FERN TEST

## 2015-03-15 MED ORDER — DIPHENHYDRAMINE HCL 50 MG/ML IJ SOLN: 12.5000 mg | INTRAMUSCULAR | Status: DC | PRN

## 2015-03-15 MED ORDER — DIPHENHYDRAMINE HCL 25 MG PO CAPS: 25.0000 mg | ORAL_CAPSULE | Freq: Four times a day (QID) | ORAL | Status: DC | PRN

## 2015-03-15 MED ORDER — IBUPROFEN 600 MG PO TABS
600.0000 mg | ORAL_TABLET | Freq: Four times a day (QID) | ORAL | Status: DC
  Administered 2015-03-15 – 2015-03-16 (×4): 600 mg via ORAL
  Filled 2015-03-15 (×4): qty 1

## 2015-03-15 MED ORDER — ONDANSETRON HCL 4 MG PO TABS: 4.0000 mg | ORAL_TABLET | ORAL | Status: DC | PRN

## 2015-03-15 MED ORDER — LACTATED RINGERS IV SOLN
500.0000 mL | INTRAVENOUS | Status: DC | PRN
  Administered 2015-03-15: 500 mL via INTRAVENOUS

## 2015-03-15 MED ORDER — PHENYLEPHRINE 40 MCG/ML (10ML) SYRINGE FOR IV PUSH (FOR BLOOD PRESSURE SUPPORT)
80.0000 ug | PREFILLED_SYRINGE | INTRAVENOUS | Status: DC | PRN
Start: 2015-03-15 — End: 2015-03-15
  Filled 2015-03-15: qty 20
  Filled 2015-03-15: qty 2

## 2015-03-15 MED ORDER — PRENATAL MULTIVITAMIN CH
1.0000 | ORAL_TABLET | Freq: Every day | ORAL | Status: DC
  Filled 2015-03-15: qty 1

## 2015-03-15 MED ORDER — ACETAMINOPHEN 325 MG PO TABS
650.0000 mg | ORAL_TABLET | ORAL | Status: DC | PRN
  Administered 2015-03-15: 650 mg via ORAL
  Filled 2015-03-15: qty 2

## 2015-03-15 MED ORDER — DIBUCAINE 1 % RE OINT: 1.0000 "application " | TOPICAL_OINTMENT | RECTAL | Status: DC | PRN

## 2015-03-15 MED ORDER — LACTATED RINGERS IV SOLN
INTRAVENOUS | Status: DC
  Administered 2015-03-15: 11:00:00 via INTRAVENOUS

## 2015-03-15 MED ORDER — LIDOCAINE HCL (PF) 1 % IJ SOLN
INTRAMUSCULAR | Status: DC | PRN
  Administered 2015-03-15 (×2): 4 mL

## 2015-03-15 MED ORDER — LIDOCAINE HCL (PF) 1 % IJ SOLN
30.0000 mL | INTRAMUSCULAR | Status: DC | PRN
  Filled 2015-03-15: qty 30

## 2015-03-15 MED ORDER — MEASLES, MUMPS & RUBELLA VAC ~~LOC~~ INJ
0.5000 mL | INJECTION | Freq: Once | SUBCUTANEOUS | Status: DC
  Filled 2015-03-15: qty 0.5

## 2015-03-15 MED ORDER — SENNOSIDES-DOCUSATE SODIUM 8.6-50 MG PO TABS
2.0000 | ORAL_TABLET | ORAL | Status: DC
  Administered 2015-03-15: 2 via ORAL
  Filled 2015-03-15: qty 2

## 2015-03-15 MED ORDER — TETANUS-DIPHTH-ACELL PERTUSSIS 5-2.5-18.5 LF-MCG/0.5 IM SUSP: 0.5000 mL | Freq: Once | INTRAMUSCULAR | Status: DC

## 2015-03-15 MED ORDER — ONDANSETRON HCL 4 MG/2ML IJ SOLN: 4.0000 mg | INTRAMUSCULAR | Status: DC | PRN

## 2015-03-15 MED ORDER — BENZOCAINE-MENTHOL 20-0.5 % EX AERO: 1.0000 "application " | INHALATION_SPRAY | CUTANEOUS | Status: DC | PRN

## 2015-03-15 MED ORDER — FENTANYL 2.5 MCG/ML BUPIVACAINE 1/10 % EPIDURAL INFUSION (WH - ANES)
14.0000 mL/h | INTRAMUSCULAR | Status: DC | PRN
  Administered 2015-03-15: 14 mL/h via EPIDURAL
  Filled 2015-03-15: qty 125

## 2015-03-15 MED ORDER — ZOLPIDEM TARTRATE 5 MG PO TABS: 5.0000 mg | ORAL_TABLET | Freq: Every evening | ORAL | Status: DC | PRN

## 2015-03-15 MED ORDER — LANOLIN HYDROUS EX OINT: TOPICAL_OINTMENT | CUTANEOUS | Status: DC | PRN

## 2015-03-15 MED ORDER — OXYTOCIN BOLUS FROM INFUSION: 500.0000 mL | INTRAVENOUS | Status: DC

## 2015-03-15 MED ORDER — WITCH HAZEL-GLYCERIN EX PADS: 1.0000 "application " | MEDICATED_PAD | CUTANEOUS | Status: DC | PRN

## 2015-03-15 MED ORDER — ONDANSETRON HCL 4 MG/2ML IJ SOLN: 4.0000 mg | Freq: Four times a day (QID) | INTRAMUSCULAR | Status: DC | PRN

## 2015-03-15 MED ORDER — SODIUM BICARBONATE 8.4 % IV SOLN
INTRAVENOUS | Status: DC | PRN
  Administered 2015-03-15: 5 mL via EPIDURAL

## 2015-03-15 MED ORDER — OXYCODONE-ACETAMINOPHEN 5-325 MG PO TABS
1.0000 | ORAL_TABLET | Freq: Four times a day (QID) | ORAL | Status: DC | PRN
  Administered 2015-03-15 – 2015-03-16 (×2): 1 via ORAL
  Filled 2015-03-15 (×2): qty 1

## 2015-03-15 MED ORDER — SIMETHICONE 80 MG PO CHEW: 80.0000 mg | CHEWABLE_TABLET | ORAL | Status: DC | PRN

## 2015-03-15 MED ORDER — EPHEDRINE 5 MG/ML INJ
10.0000 mg | INTRAVENOUS | Status: DC | PRN
  Filled 2015-03-15: qty 2

## 2015-03-15 MED ORDER — ACETAMINOPHEN 325 MG PO TABS: 650.0000 mg | ORAL_TABLET | ORAL | Status: DC | PRN

## 2015-03-15 MED ORDER — CITRIC ACID-SODIUM CITRATE 334-500 MG/5ML PO SOLN
30.0000 mL | ORAL | Status: DC | PRN
  Administered 2015-03-15: 30 mL via ORAL
  Filled 2015-03-15: qty 15

## 2015-03-15 MED ORDER — OXYTOCIN 10 UNIT/ML IJ SOLN
2.5000 [IU]/h | INTRAVENOUS | Status: DC
  Administered 2015-03-15: 2.5 [IU]/h via INTRAVENOUS
  Filled 2015-03-15: qty 4

## 2015-03-15 NOTE — Anesthesia Preprocedure Evaluation (Signed)
Anesthesia Evaluation  Patient identified by MRN, date of birth, ID band Patient awake    Reviewed: Allergy & Precautions, NPO status , Patient's Chart, lab work & pertinent test results  History of Anesthesia Complications Negative for: history of anesthetic complications  Airway Mallampati: II  TM Distance: >3 FB Neck ROM: Full    Dental no notable dental hx. (+) Dental Advisory Given   Pulmonary neg pulmonary ROS,    Pulmonary exam normal breath sounds clear to auscultation       Cardiovascular negative cardio ROS Normal cardiovascular exam Rhythm:Regular Rate:Normal     Neuro/Psych negative neurological ROS  negative psych ROS   GI/Hepatic negative GI ROS, Neg liver ROS,   Endo/Other  obesity  Renal/GU negative Renal ROS  negative genitourinary   Musculoskeletal negative musculoskeletal ROS (+)   Abdominal   Peds negative pediatric ROS (+)  Hematology negative hematology ROS (+)   Anesthesia Other Findings   Reproductive/Obstetrics (+) Pregnancy                             Anesthesia Physical Anesthesia Plan  ASA: II  Anesthesia Plan: Epidural   Post-op Pain Management:    Induction:   Airway Management Planned:   Additional Equipment:   Intra-op Plan:   Post-operative Plan:   Informed Consent: I have reviewed the patients History and Physical, chart, labs and discussed the procedure including the risks, benefits and alternatives for the proposed anesthesia with the patient or authorized representative who has indicated his/her understanding and acceptance.   Dental advisory given  Plan Discussed with: CRNA  Anesthesia Plan Comments:         Anesthesia Quick Evaluation  

## 2015-03-15 NOTE — Anesthesia Postprocedure Evaluation (Signed)
Anesthesia Post Note  Patient: Maria Pugh  Procedure(s) Performed: * No procedures listed *  Patient location during evaluation: Mother Baby Anesthesia Type: Epidural Level of consciousness: awake Pain management: pain level controlled Vital Signs Assessment: post-procedure vital signs reviewed and stable Respiratory status: spontaneous breathing Postop Assessment: no headache, no backache, epidural receding, patient able to bend at knees, no signs of nausea or vomiting and adequate PO intake Anesthetic complications: no    Last Vitals:  Filed Vitals:   03/15/15 1345 03/15/15 1440  BP: 116/72 115/73  Pulse: 92 83  Temp:  36.9 C  Resp:  18    Last Pain:  Filed Vitals:   03/15/15 1443  PainSc: 0-No pain                 Jameek Bruntz

## 2015-03-15 NOTE — MAU Note (Signed)
C/o ? SROM @ 0730; c/o ucs since 0730;

## 2015-03-15 NOTE — Progress Notes (Signed)
DR. Ottie Glazier NOTIFIED ABOUT PATIENT COMPLAINT OF CONTINUED CRAMPING AFTER BEING MEDICATED WITH IBUPROFEN  AND ACETAMINOPHEN 650 MG. ORDER TO GIVE 1 PERCOCET Q6H PRN FOR CRAMPING PAIN

## 2015-03-15 NOTE — H&P (Signed)
Obstetric History and Physical  Maria Pugh is a 30 y.o. G2P1001 with IUP at [redacted]w[redacted]d presenting with contractions and after SROM at 0730.  Minimal vaginal bleeding, intact, ruptured membranes, with active fetal movement.    Prenatal Course Source of Care: [facility] Pregnancy complications or risks: None  Clinic  [facility] Prenatal Labs  Dating 7 week [place] Blood type: --/--/B POS (01/28 1035) B pos  Genetic Screen 1 Screen:   WNL      AFP:   negative  Antibody:PENDING (01/28 1035)Neg  Anatomic [place]  normal Rubella: 3.47 (06/22 1451)Immune  GTT  Third trimester: 125 RPR: NON REAC (11/11 0915) NR  Flu vaccine  9/16 HBsAg: NEGATIVE (06/22 1451) neg  TDaP vaccine 12/27/14                                   HIV: NONREACTIVE (11/11 0915) NR  GBS Negative GBS: Negative  Baby Food Breast Pap:WNL  Contraception OCP   Circumcision Girl   Pediatrician Dr. Donnie Coffin   Support Person Husband     Past Medical History  Diagnosis Date  . Shingles 2013 and 2014    Past Surgical History  Procedure Laterality Date  . Mouth surgery  07/2014    OB History  Gravida Para Term Preterm AB SAB TAB Ectopic Multiple Living  0 0 0 0 0 0 1    # Outcome Date GA Lbr Len/2nd Weight Sex Delivery Anes PTL Lv  2 Current           1 Term 02/02/10 [redacted]w[redacted]d  6 lb 5 oz (2.863 kg) M Vag-Spont EPI N Y      Social History   Social History  . Marital Status: Married    Spouse Name: N/A  . Number of Children: N/A  . Years of Education: N/A   Social History Main Topics  . Smoking status: Never Smoker   . Smokeless tobacco: Never Used  . Alcohol Use: No  . Drug Use: No  . Sexual Activity: Yes    Birth Control/ Protection: Pill   Other Topics Concern  . None   Social History Narrative    Family History  Problem Relation Age of Onset  . Mental illness Mother   . Thyroid disease Mother   . Diabetes Paternal Grandmother   . Autism Brother   . Graves' disease Mother   . Acromegaly Cousin      Prescriptions prior to admission  Medication Sig Dispense Refill Last Dose  . multivitamin (VIT W/EXTRA C) CHEW chewable tablet Chew 1 tablet by mouth daily.   Taking    No Known Allergies  Review of Systems: Negative except for what is mentioned in HPI.  Physical Exam: BP 135/86 mmHg  Pulse 118  Temp(Src) 98.8 F (37.1 C) (Oral)  Resp 22  Ht 5' (1.524 m)  Wt 167 lb (75.751 kg)  BMI 32.62 kg/m2  SpO2 99% CONSTITUTIONAL: Well-developed, well-nourished female in no acute distress.  HENT:  Normocephalic, atraumatic, External right and left ear normal. Oropharynx is clear and moist EYES: Conjunctivae and EOM are normal. Pupils are equal, round, and reactive to light. No scleral icterus.  NECK: Normal range of motion, supple, no masses SKIN: Skin is warm and dry. No rash noted. Not diaphoretic. No erythema. No pallor. NEUROLGIC: Alert and oriented to person, place, and time. Normal reflexes, muscle tone coordination. No cranial nerve deficit noted.  PSYCHIATRIC: Normal mood and affect. Normal behavior. Normal judgment and thought content. CARDIOVASCULAR: Normal heart rate noted, regular rhythm RESPIRATORY: Effort and breath sounds normal, no problems with respiration noted ABDOMEN: Soft, nontender, nondistended, gravid. MUSCULOSKELETAL: Normal range of motion. No edema and no tenderness. 2+ distal pulses.  Cervical Exam: Dilation: 8 Effacement (%): 100 Cervical Position: Posterior Station: +1 Presentation: Vertex Exam by:: Katrinka Blazing Presentation: cephalic FHT:  Baseline rate 140 bpm   Variability moderate  Accelerations present   Decelerations none Contractions: Every 1-3 mins   Pertinent Labs/Studies:   Results for orders placed or performed during the hospital encounter of 03/15/15 (from the past 24 hour(s))  CBC     Status: Abnormal   Collection Time: 03/15/15 10:35 AM  Result Value Ref Range   WBC 13.5 (H) 4.0 - 10.5 K/uL   RBC 4.13 3.87 - 5.11 MIL/uL   Hemoglobin 12.3  12.0 - 15.0 g/dL   HCT 16.1 09.6 - 04.5 %   MCV 88.1 78.0 - 100.0 fL   MCH 29.8 26.0 - 34.0 pg   MCHC 33.8 30.0 - 36.0 g/dL   RDW 40.9 81.1 - 91.4 %   Platelets 263 150 - 400 K/uL  Type and screen     Status: None (Preliminary result)   Collection Time: 03/15/15 10:35 AM  Result Value Ref Range   ABO/RH(D) B POS    Antibody Screen PENDING    Sample Expiration 03/18/2015   Fern Test     Status: Abnormal   Collection Time: 03/15/15 10:41 AM  Result Value Ref Range   POCT Fern Test      Assessment : Maria Pugh is a 30 y.o. G2P1001 at [redacted]w[redacted]d being admitted for labor after SROM at 0730 today.  Plan: Labor: Expectant management.  Augmentation as needed, per protocol FWB: Reassuring fetal heart tracing.  GBS negative Delivery plan: Hopeful for vaginal delivery   Jaynie Collins, MD, FACOG Attending Obstetrician & Gynecologist Faculty Practice, [facility]

## 2015-03-15 NOTE — Anesthesia Procedure Notes (Signed)
Epidural Patient location during procedure: OB  Staffing Anesthesiologist: Salimah Martinovich Performed by: anesthesiologist   Preanesthetic Checklist Completed: patient identified, site marked, surgical consent, pre-op evaluation, timeout performed, IV checked, risks and benefits discussed and monitors and equipment checked  Epidural Patient position: sitting Prep: site prepped and draped and DuraPrep Patient monitoring: continuous pulse ox and blood pressure Approach: midline Location: L3-L4 Injection technique: LOR saline  Needle:  Needle type: Tuohy  Needle gauge: 17 G Needle length: 9 cm and 9 Needle insertion depth: 6 cm Catheter type: closed end flexible Catheter size: 19 Gauge Catheter at skin depth: 11 cm Test dose: negative  Assessment Events: blood not aspirated, injection not painful, no injection resistance, negative IV test and paresthesia (L sided paresthesia while threading catheter, immediately resolved, not present when catheter positionined in final position or prior to injecting through catheter)  Additional Notes Patient identified. Risks/Benefits/Options discussed with patient including but not limited to bleeding, infection, nerve damage, paralysis, failed block, incomplete pain control, headache, blood pressure changes, nausea, vomiting, reactions to medication both or allergic, itching and postpartum back pain. Confirmed with bedside nurse the patient's most recent platelet count. Confirmed with patient that they are not currently taking any anticoagulation, have any bleeding history or any family history of bleeding disorders. Patient expressed understanding and wished to proceed. All questions were answered. Sterile technique was used throughout the entire procedure. Please see nursing notes for vital signs. Test dose was given through epidural catheter and negative prior to continuing to dose epidural or start infusion. Warning signs of high block given to the  patient including shortness of breath, tingling/numbness in hands, complete motor block, or any concerning symptoms with instructions to call for help. Patient was given instructions on fall risk and not to get out of bed. All questions and concerns addressed with instructions to call with any issues or inadequate analgesia.

## 2015-03-16 LAB — RPR: RPR Ser Ql: NONREACTIVE

## 2015-03-16 MED ORDER — ACETAMINOPHEN 325 MG PO TABS: 650.0000 mg | ORAL_TABLET | ORAL | Status: DC | PRN

## 2015-03-16 MED ORDER — SENNOSIDES-DOCUSATE SODIUM 8.6-50 MG PO TABS: 2.0000 | ORAL_TABLET | Freq: Every evening | ORAL | Status: DC | PRN

## 2015-03-16 MED ORDER — IBUPROFEN 600 MG PO TABS: 600.0000 mg | ORAL_TABLET | Freq: Four times a day (QID) | ORAL | Status: DC

## 2015-03-16 MED ORDER — NORETHINDRONE 0.35 MG PO TABS: 1.0000 | ORAL_TABLET | Freq: Every day | ORAL | Status: DC

## 2015-03-16 NOTE — Lactation Note (Signed)
This note was copied from the chart of Maria Pugh. Lactation Consultation Note  P2, Mother states baby is breastfeeding well but wants to feed all the time so she is supplementing w/ formula. Explained supply and demand, cluster feeding and the importance of breastfeeding often to establish her milk supply. She states she knows baby likes the breastmilk better than formula.  Suggest she breastfeed before offering formula. Mother states she prefers to give formula so she can rest between feedings. Reviewed engorgement care and monitoring voids/stools. Mom made aware of O/P services, breastfeeding support groups, community resources, and our phone # for post-discharge questions.  Mom encouraged to feed baby 8-12 times/24 hours and with feeding cues.     Patient Name: Maria Pugh [id number] Date: 03/16/2015 Reason for consult: Initial assessment   Maternal Data    Feeding    LATCH Score/Interventions                      Lactation Tools Discussed/Used     Consult Status Consult Status: Complete    Hardie Pulley 03/16/2015, 10:17 AM

## 2015-03-16 NOTE — Discharge Instructions (Signed)

## 2015-03-16 NOTE — Discharge Summary (Signed)
OB Discharge Summary     Patient Name: Maria Pugh DOB: 11-03-1985 MRN: [id number]  Date of admission: 03/15/2015 Delivering MD: Shonna Chock [place]   Date of discharge: 03/16/2015  Admitting diagnosis: active labor 38 wks water broke this morning Intrauterine pregnancy: [redacted]w[redacted]d     Secondary diagnosis:  Active Problems:   Active labor at term  Additional problems: none     Discharge diagnosis: Term Pregnancy Delivered                                                                                                Post partum procedures:none  Augmentation: none  Complications: None  Hospital course:  Onset of Labor With Vaginal Delivery     30 y.o. yo [id number] at [redacted]w[redacted]d was admitted in Active Labor on 03/15/2015. Patient had an uncomplicated labor course as follows:  Membrane Rupture Time/Date: 7:30 AM ,03/15/2015   Intrapartum Procedures: Episiotomy: None [1]                                         Lacerations:  None [1]  Patient had a delivery of a Viable infant. 03/15/2015  Information for the patient's newborn:  Corrisa, Gibby [[id number]]  Delivery Method: Vaginal, Spontaneous Delivery (Filed from Delivery Summary)    Pateint had an uncomplicated postpartum course.  She is ambulating, tolerating a regular diet, passing flatus, and urinating well. Patient is discharged home in stable condition on 03/16/2015.    Physical exam  Filed Vitals:   03/15/15 1550 03/15/15 1927 03/16/15 0230 03/16/15 0605  BP: 122/77 100/60 116/75 97/62  Pulse: 93 93 79 82  Temp: 98.3 F (36.8 C) 98.4 F (36.9 C) 98.6 F (37 C) 98 F (36.7 C)  TempSrc: Oral Oral Oral Oral  Resp: Height:      Weight:      SpO2:  99% 100% 100%   General: alert, cooperative and no distress Lochia: appropriate Uterine Fundus: firm Incision: N/A DVT Evaluation: No cords or calf tenderness. No significant calf/ankle edema. Labs: Lab Results  Component Value Date   WBC 13.5*  03/15/2015   HGB 12.3 03/15/2015   HCT 36.4 03/15/2015   MCV 88.1 03/15/2015   PLT 263 03/15/2015   CMP Latest Ref Rng 04/11/2012  Glucose 70 - 99 mg/dL 86  BUN 6 - 23 mg/dL 11  Creatinine 9.52 - 8.41 mg/dL 3.24  Sodium 401 - 027 mEq/L 139  Potassium 3.5 - 5.3 mEq/L 3.8  Chloride 96 - 112 mEq/L 104  CO2 19 - 32 mEq/L 27  Calcium 8.4 - 10.5 mg/dL 8.4  Total Protein 6.0 - 8.3 g/dL 6.5  Total Bilirubin 0.3 - 1.2 mg/dL 0.3  Alkaline Phos 39 - 117 U/L 47  AST 0 - 37 U/L 16  ALT 0 - 35 U/L 10    Discharge instruction: per After Visit Summary and "Baby and Me Booklet".  After visit meds:    Medication List    TAKE  these medications        acetaminophen 325 MG tablet  Commonly known as:  TYLENOL  Take 2 tablets (650 mg total) by mouth every 4 (four) hours as needed (for pain scale < 4).     FLINTSTONES GUMMIES PO  Take 2 each by mouth at bedtime.     ibuprofen 600 MG tablet  Commonly known as:  ADVIL,MOTRIN  Take 1 tablet (600 mg total) by mouth every 6 (six) hours.     norethindrone 0.35 MG tablet  Commonly known as:  MICRONOR,CAMILA,ERRIN  Take 1 tablet (0.35 mg total) by mouth daily.     senna-docusate 8.6-50 MG tablet  Commonly known as:  Senokot-S  Take 2 tablets by mouth at bedtime as needed for mild constipation.        Diet: routine diet  Activity: Advance as tolerated. Pelvic rest for 6 weeks.   Outpatient follow up:6 weeks Follow up Appt: Future Appointments Date Time Provider Department Center  03/20/2015 11:00 AM Catalina Antigua, MD [facility]   Follow up Visit:No Follow-up on file.  Postpartum contraception: Progesterone only pills  Newborn Data: Live born female  Birth Weight: 7 lb 0.4 oz (3185 g) APGAR: 8, 9  Baby Feeding: Breast Disposition:home with mother   03/16/2015 Silvano Bilis, MD

## 2015-03-16 NOTE — Progress Notes (Signed)
STUDENT POSTPARTUM PROGRESS NOTE  Post Partum Day 1 Subjective:  Maria Pugh is a 30 y.o. [id number] [redacted]w[redacted]d s/p SVD.  No acute events overnight.  Pt denies problems with ambulating, voiding, or po intake.  Pain is well controlled. Requiring ibuprofen and percocet q8h. Last dosed ~5 AM.  She has had flatus. She has not had bowel movement.  Lochia Moderate and associated with cramps. Pt notes less lochia than with prior pregnancy.   Objective: Blood pressure 97/62, pulse 82, temperature 98 F (36.7 C), temperature source Oral, resp. rate 18, height 5' (1.524 m), weight 75.751 kg (167 lb), SpO2 100 %, unknown if currently breastfeeding.  Physical Exam:  General: alert, cooperative and no distress Chest: CTAB Heart: RRR no m/r/g Abdomen: +BS, soft, mildly tender Uterine Fundus: firm at level of umbilicus Extremities: 1+ pretibial pitting edema, non-pitting edema in feet bilaterally   Recent Labs  03/15/15 1035  HGB 12.3  HCT 36.4    Assessment/Plan:  ASSESSMENT: Maria Pugh is a 30 y.o. [id number] [redacted]w[redacted]d s/p SVD with no complications.  Discharge home  Plans to breastfeed.  Plans to start POP.   LOS: 1 day   Ann Maki 03/16/2015, 7:46 AM

## 2015-03-17 ENCOUNTER — Ambulatory Visit (INDEPENDENT_AMBULATORY_CARE_PROVIDER_SITE_OTHER): Payer: Medicaid Other | Admitting: Family Medicine

## 2015-03-17 ENCOUNTER — Telehealth: Payer: Self-pay | Admitting: *Deleted

## 2015-03-17 ENCOUNTER — Encounter: Payer: Self-pay | Admitting: Family Medicine

## 2015-03-17 VITALS — BP 116/82 | HR 91 | Resp 18 | Ht 60.0 in | Wt 162.0 lb

## 2015-03-17 DIAGNOSIS — N858 Other specified noninflammatory disorders of uterus: Secondary | ICD-10-CM | POA: Diagnosis not present

## 2015-03-17 DIAGNOSIS — N9489 Other specified conditions associated with female genital organs and menstrual cycle: Secondary | ICD-10-CM

## 2015-03-17 MED ORDER — NAPROXEN 500 MG PO TABS: 500.0000 mg | ORAL_TABLET | Freq: Two times a day (BID) | ORAL | Status: DC

## 2015-03-17 MED ORDER — AMOXICILLIN-POT CLAVULANATE 875-125 MG PO TABS: 1.0000 | ORAL_TABLET | Freq: Two times a day (BID) | ORAL | Status: DC

## 2015-03-17 NOTE — Telephone Encounter (Signed)
Spoke to pt, states abdominal cramping started late last night and was increasing in intensity, tried Tylenol and Ibuprofen with no relief.  Pt declines fever, difficulty urinating, does have mild abdominal tenderness with palpation.  Scheduled appt with Dr Shawnie Pons at 1:30 for evaluation.

## 2015-03-17 NOTE — Telephone Encounter (Signed)
-----   Message from Olevia Bowens sent at 03/17/2015  9:23 AM EST ----- Regarding: Rx Request Contact: [phone] Delivered, ibuprofen is not enough, wants to know if there is anything else she can do or can she have some pain medicine called in

## 2015-03-17 NOTE — Progress Notes (Signed)
    Subjective:    Patient ID: Maria Pugh is a 30 y.o. female presenting with Postpartum check  on 03/17/2015  HPI: Reports fairly significant cramping pain, worse with breastfeeding. Trying Ibuprofen and tylenol and heating pads. Pain is worse with movement or touching her belly.  SVD on 1/28, no tears.  Denies fever, chills, back pain, dysuria or foul smelling discharge.  Normal lochia.  Review of Systems  Constitutional: Negative for fever and chills.  Respiratory: Negative for shortness of breath.   Cardiovascular: Negative for chest pain.  Gastrointestinal: Negative for nausea, vomiting and abdominal pain.  Genitourinary: Negative for dysuria.  Skin: Negative for rash.      Objective:    BP 116/82 mmHg  Pulse 91  Resp 18  Ht 5' (1.524 m)  Wt 162 lb (73.483 kg)  BMI 31.64 kg/m2  Breastfeeding? Yes Physical Exam  Constitutional: She is oriented to person, place, and time. She appears well-developed and well-nourished. No distress.  HENT:  Head: Normocephalic and atraumatic.  Eyes: No scleral icterus.  Neck: Neck supple.  Cardiovascular: Normal rate.   Pulmonary/Chest: Effort normal.  Abdominal: Soft. There is tenderness (uterine tenderness). There is no rebound.  Neurological: She is alert and oriented to person, place, and time.  Skin: Skin is warm and dry.  Psychiatric: She has a normal mood and affect.        Assessment & Plan:  Uterine cramping - ? early endometritis, given uterine tenderness--trial of Augmentin--scheduled Naprosyn with food. Stop Ibuprofen. - Plan: naproxen (NAPROSYN) 500 MG tablet, amoxicillin-clavulanate (AUGMENTIN) 875-125 MG tablet   Return in about 4 weeks (around 04/14/2015).  Rickeya Manus S 03/17/2015 1:31 PM

## 2015-03-17 NOTE — Patient Instructions (Signed)
Endometritis °Endometritis is an irritation, soreness, and swelling (inflammation) of the lining of the uterus (endometrium).  °CAUSES  °· Bacterial infections. °· Sexually transmitted infections (STIs). °· Having a miscarriage or childbirth, especially after a long labor or cesarean delivery. °· Certain gynecological procedures (such as dilation and curettage, hysteroscopy, or contraceptive insertion). °SIGNS AND SYMPTOMS  °· Fever. °· Lower abdominal or pelvic pain. °· Abnormal vaginal discharge or bleeding. °· Abdominal bloating (distention) or swelling. °· General discomfort or ill feeling. °· Discomfort with bowel movements. °DIAGNOSIS  °A physical and pelvic exam are performed. Other tests may include: °· Cultures from the cervix. °· Blood tests. °· Examining a tissue sample of the uterine lining (endometrial biopsy). °· Examining discharge under a microscope (wet prep). °· Laparoscopy. °TREATMENT  °Antibiotic medicines are usually given. Other treatments may include: °· Fluids through an IV tube inserted in your vein. °· Rest. °HOME CARE INSTRUCTIONS  °· Take over-the-counter or prescription medicines for pain, discomfort, or fever as directed by your health care provider. °· Take your antibiotics as directed. Finish them even if you start to feel better. °· Resume your normal diet and activities as directed or as tolerated. °· Do not douche or have sexual intercourse until your health care provider says it is okay. °· Do not have sexual intercourse until your partner has been treated if your endometritis is caused by an STI. °SEEK IMMEDIATE MEDICAL CARE IF:  °· You have swelling or increasing pain in the abdomen. °· You have a fever. °· You have bad smelling vaginal discharge, or you have an increased amount of discharge. °· You have abnormal vaginal bleeding. °· Your medicine is not helping with the pain. °· You experience any problems that may be related to the medicine you are taking. °· You have nausea  and vomiting, or you cannot keep foods down. °· You have pain with bowel movements. °MAKE SURE YOU:  °· Understand these instructions. °· Will watch your condition. °· Will get help right away if you are not doing well or get worse. °  °This information is not intended to replace advice given to you by your health care provider. Make sure you discuss any questions you have with your health care provider. °  °Document Released: 01/26/2001 Document Revised: 10/04/2012 Document Reviewed: 08/31/2012 °Elsevier Interactive Patient Education ©2016 [employer] ° °

## 2015-03-17 NOTE — Progress Notes (Signed)
Pt had a vaginal delivery on 03-15-15, states she started having increasing abdominal cramps and pain last night that was not relieved by taking Tylenol or Ibuprofen.  Declines any fever or problems with urination, has slight tenderness on the abdomen with palpation.

## 2015-03-20 ENCOUNTER — Encounter: Payer: Medicaid Other | Admitting: Obstetrics and Gynecology

## 2015-04-21 ENCOUNTER — Encounter: Payer: Self-pay | Admitting: Obstetrics and Gynecology

## 2015-04-21 ENCOUNTER — Ambulatory Visit (INDEPENDENT_AMBULATORY_CARE_PROVIDER_SITE_OTHER): Payer: Medicaid Other | Admitting: Obstetrics and Gynecology

## 2015-04-21 DIAGNOSIS — Z30011 Encounter for initial prescription of contraceptive pills: Secondary | ICD-10-CM

## 2015-04-21 NOTE — Progress Notes (Signed)
  Subjective:     Maria Pugh is a 30 y.o. female who presents for a postpartum visit. She is 5 weeks postpartum following a spontaneous vaginal delivery. I have fully reviewed the prenatal and intrapartum course. The delivery was at 38.4 gestational weeks. Outcome: spontaneous vaginal delivery. Anesthesia: epidural. Postpartum course has been uncomplicated. Baby's course has been uncomplicated. Baby is feeding by both breast and bottle - Similac Advance. Bleeding no bleeding. Bowel function is normal. Bladder function is normal. Patient is not sexually active. Contraception method is abstinence and oral progesterone-only contraceptive. Postpartum depression screening: negative.     Review of Systems Pertinent items are noted in HPI.   Objective:    BP 127/73 mmHg  Pulse 74  Resp 16  Ht 5\' 1"  (1.549 m)  Wt 146 lb (66.225 kg)  BMI 27.60 kg/m2  General:  alert, cooperative and no distress   Breasts:  inspection negative, no nipple discharge or bleeding, no masses or nodularity palpable  Lungs: clear to auscultation bilaterally  Heart:  regular rate and rhythm  Abdomen: soft, non-tender; bowel sounds normal; no masses,  no organomegaly   Vulva:  normal  Vagina: normal vagina, no discharge, exudate, lesion, or erythema  Cervix:  multiparous appearance  Corpus: normal size, contour, position, consistency, mobility, non-tender  Adnexa:  normal adnexa and no mass, fullness, tenderness  Rectal Exam: Not performed.        Assessment:     Normal postpartum exam. Pap smear not done at today's visit.   Plan:    1. Contraception: oral progesterone-only contraceptive 2. Patient is medically cleared to resume all activities of daily living. I encouraged her to continue her efforts with breastfeeding 3. Follow up in: 1 year or as needed.

## 2015-06-16 ENCOUNTER — Telehealth: Payer: Self-pay | Admitting: *Deleted

## 2015-06-16 DIAGNOSIS — Z309 Encounter for contraceptive management, unspecified: Secondary | ICD-10-CM

## 2015-06-16 MED ORDER — NORGESTIMATE-ETH ESTRADIOL 0.25-35 MG-MCG PO TABS: 1.0000 | ORAL_TABLET | Freq: Every day | ORAL | Status: DC

## 2015-06-16 NOTE — Telephone Encounter (Signed)
Called pt to adv will send in Ortho Cyclen as she was on that before. Pt agreed.

## 2015-06-16 NOTE — Telephone Encounter (Signed)
-----   Message from Olevia BowensJacinda S Battle sent at 06/16/2015  3:55 PM EDT ----- Regarding: Rx Swap Contact: [phone] No longer breastfeeding, wants birth control changed [place] [facility] [address] [place]

## 2015-08-05 ENCOUNTER — Telehealth: Payer: Self-pay | Admitting: *Deleted

## 2015-08-05 NOTE — Telephone Encounter (Signed)
Pt has AUB after changing from mini pill to ortho. She has been on ortho for 3 months and has bled the entire time. Advised pt she would need to be evaluated since we have change OCP once already. Pt expressed understanding.

## 2015-08-15 ENCOUNTER — Ambulatory Visit (INDEPENDENT_AMBULATORY_CARE_PROVIDER_SITE_OTHER): Payer: Medicaid Other | Admitting: Obstetrics and Gynecology

## 2015-08-15 ENCOUNTER — Encounter: Payer: Self-pay | Admitting: Obstetrics and Gynecology

## 2015-08-15 VITALS — BP 107/74 | HR 85 | Resp 16 | Wt 137.0 lb

## 2015-08-15 DIAGNOSIS — Z3041 Encounter for surveillance of contraceptive pills: Secondary | ICD-10-CM

## 2015-08-15 DIAGNOSIS — G5602 Carpal tunnel syndrome, left upper limb: Secondary | ICD-10-CM | POA: Diagnosis not present

## 2015-08-15 DIAGNOSIS — N921 Excessive and frequent menstruation with irregular cycle: Secondary | ICD-10-CM | POA: Diagnosis not present

## 2015-08-15 DIAGNOSIS — Z309 Encounter for contraceptive management, unspecified: Secondary | ICD-10-CM

## 2015-08-15 MED ORDER — NORGESTIMATE-ETH ESTRADIOL 0.25-35 MG-MCG PO TABS: 1.0000 | ORAL_TABLET | Freq: Every day | ORAL | Status: DC

## 2015-08-15 MED ORDER — NORETHINDRONE-ETH ESTRADIOL 0.4-35 MG-MCG PO TABS: ORAL_TABLET | ORAL | Status: DC

## 2015-08-15 NOTE — Progress Notes (Signed)
30 yo G2P2 presenting today for the evaluation of AUB. Patient changed contraception from mini-pill to COC. She states that she experienced vaginal bleeding daily for the 2 packs which seems to now have slowed down. She has taken the first 15 pills of her 3 rd pack and continues to experience some vaginal bleeding every 3 days. She has used COC before without any complications She also reports having carpal tunnel syndrome in her left wrist during her pregnancy. It doesn't seem to have improved. She finds it difficult to carry the car seat. She used to type for 12 hours days.  Past Medical History  Diagnosis Date  . Shingles 2013 and 2014   Past Surgical History  Procedure Laterality Date  . Mouth surgery  07/2014   Family History  Problem Relation Age of Onset  . Mental illness Mother   . Thyroid disease Mother   . Diabetes Paternal Grandmother   . Autism Brother   . Graves' disease Mother   . Acromegaly Cousin    Social History  Substance Use Topics  . Smoking status: Never Smoker   . Smokeless tobacco: Never Used  . Alcohol Use: No   ROS See pertinent in HPI  Blood pressure 107/74, pulse 85, resp. rate 16, weight 137 lb (62.143 kg), last menstrual period 08/11/2015, currently breastfeeding. GENERAL: Well-developed, well-nourished female in no acute distress.   A/P 30 yo with AUB while on COC and carpal tunnel syndrome - Will try an OCP taper followed by regular COC. If bleeding continues, we will switch COC dosage. Rx ovcon-35 taper provided followed by [facility] - Will refer to ortho for evaluation of carpal tunnel syndrome - RTC prn

## 2015-09-12 ENCOUNTER — Telehealth: Payer: Self-pay | Admitting: *Deleted

## 2015-09-12 NOTE — Telephone Encounter (Signed)
-----   Message from Olevia Bowens sent at 09/12/2015  8:59 AM EDT ----- Regarding: Advise Contact: [phone] Pills not helping, still having AUB.

## 2015-09-15 NOTE — Telephone Encounter (Signed)
Pt started the birth control taper pack a few weeks ago, called stating she has had bleeding on and off over the past few weeks but it is now heavier.  Informed pt that this could be a menstrual cycle and to continue taking the birth control as prescribed.  Informed her that it takes some time for her body to adjust and to give the birth control another month or so to see the full effects it will have on her body.  At that time if she continues to have problems with it to call back or if the bleeding becomes heavy and she starts to completely saturate more than one pad per hour with clots to follow-up with [place] at that time. Pt acknowledged instructions.

## 2015-11-18 ENCOUNTER — Ambulatory Visit (INDEPENDENT_AMBULATORY_CARE_PROVIDER_SITE_OTHER): Payer: Medicaid Other | Admitting: Obstetrics & Gynecology

## 2015-11-18 ENCOUNTER — Encounter: Payer: Self-pay | Admitting: Obstetrics & Gynecology

## 2015-11-18 DIAGNOSIS — N921 Excessive and frequent menstruation with irregular cycle: Secondary | ICD-10-CM | POA: Diagnosis not present

## 2015-11-18 MED ORDER — ETHYNODIOL DIAC-ETH ESTRADIOL 1-50 MG-MCG PO TABS: 1.0000 | ORAL_TABLET | Freq: Every day | ORAL | 11 refills | Status: DC

## 2015-11-18 NOTE — Progress Notes (Signed)
Patient ID: Maria Pugh, female   DOB: 1985-08-17, 30 y.o.   MRN: [id number]  Chief Complaint  Patient presents with  . Vaginal Bleeding  irregular bleeding on OCP  HPI No LMP recorded (lmp unknown). Patient is not currently having periods (Reason: Oral contraceptives).  Maria FabianJaclyn C Hulen is a 30 y.o. female.  [id number] Irregular and occasional heavy bleeding on OCP. Her pills have been changed with no improvement. Her bleeding increased after intercourse 2 days ago. HPI  Past Medical History:  Diagnosis Date  . Shingles 2013 and 2014    Past Surgical History:  Procedure Laterality Date  . MOUTH SURGERY  07/2014    Family History  Problem Relation Age of Onset  . Mental illness Mother   . Thyroid disease Mother   . Diabetes Paternal Grandmother   . Autism Brother   . Graves' disease Mother   . Acromegaly Cousin     Social History Social History  Substance Use Topics  . Smoking status: Never Smoker  . Smokeless tobacco: Never Used  . Alcohol use No    No Known Allergies  Current Outpatient Prescriptions  Medication Sig Dispense Refill  . norgestimate-ethinyl estradiol (ORTHO-CYCLEN,SPRINTEC,PREVIFEM) 0.25-35 MG-MCG tablet Take 1 tablet by mouth daily. 1 Package 11  . Pediatric Multivit-Minerals-C (FLINTSTONES GUMMIES PO) Take 2 each by mouth at bedtime.    [name]. ethynodiol-ethinyl estradiol (ZOVIA 1/50E, 28,) 1-50 MG-MCG tablet Take 1 tablet by mouth daily. 1 Package 11  . norethindrone-ethinyl estradiol (OVCON-35, 28,) 0.4-35 MG-MCG tablet Take 3 pills a day for 3 days, 2 pills a day for 3 days and 1 pill a day until you complete active pills (Patient not taking: Reported on 11/18/2015) 1 Package 0   No current facility-administered medications for this visit.     Review of Systems Review of Systems  Constitutional: Negative.   Gastrointestinal: Negative.   Genitourinary: Positive for pelvic pain (cramps) and vaginal bleeding. Negative for vaginal discharge.     Blood pressure (!) 129/92, pulse 73, weight 141 lb 8 oz (64.2 kg), not currently breastfeeding.  Physical Exam Physical Exam  Constitutional: She is oriented to person, place, and time. She appears well-developed.  Genitourinary: Vagina normal and uterus normal. No vaginal discharge found.  Genitourinary Comments: Cervix and adnexal normal  Neurological: She is alert and oriented to person, place, and time.  Skin: Skin is warm and dry.  Psychiatric: She has a normal mood and affect. Her behavior is normal.    Data Reviewed Pap result  Assessment    DUB, breakthrough bleeding on OCP    Plan    Zovia 1/50 Pelvic [place] RTC 6 weeks       Twala Collings 11/18/2015, 3:56 PM

## 2015-11-18 NOTE — Patient Instructions (Signed)

## 2015-11-24 ENCOUNTER — Ambulatory Visit (HOSPITAL_COMMUNITY)
Admission: RE | Admit: 2015-11-24 | Discharge: 2015-11-24 | Disposition: A | Discharge status: Home / Self Care | Payer: Medicaid Other | Source: Ambulatory Visit | Attending: Obstetrics & Gynecology | Admitting: Obstetrics & Gynecology

## 2015-11-24 ENCOUNTER — Encounter (HOSPITAL_COMMUNITY): Payer: Self-pay

## 2015-11-24 DIAGNOSIS — N921 Excessive and frequent menstruation with irregular cycle: Secondary | ICD-10-CM

## 2015-11-24 DIAGNOSIS — N939 Abnormal uterine and vaginal bleeding, unspecified: Secondary | ICD-10-CM | POA: Diagnosis present

## 2016-01-02 ENCOUNTER — Telehealth: Payer: Self-pay | Admitting: *Deleted

## 2016-01-02 NOTE — Telephone Encounter (Signed)
Informed pt of normal US results, states she started the new OCPs last month and she has continued to bleed heavier and is now experiencing weight loss and hair loss as well, scheduled for TSH on 01-05-16 and follow-up with the provider on 01-06-16.

## 2016-01-02 NOTE — Telephone Encounter (Signed)
-----   Message from Olevia BowensJacinda S Battle sent at 01/02/2016  8:57 AM EST ----- Regarding: Results Contact: [phone] Wants to know the results of her ultrasound she had a couple months ago

## 2016-01-05 ENCOUNTER — Other Ambulatory Visit (INDEPENDENT_AMBULATORY_CARE_PROVIDER_SITE_OTHER): Payer: Medicaid Other | Admitting: *Deleted

## 2016-01-05 DIAGNOSIS — R634 Abnormal weight loss: Secondary | ICD-10-CM

## 2016-01-05 LAB — TSH: TSH: 1.2 mIU/L

## 2016-01-05 NOTE — Progress Notes (Signed)
Pt called the office last week c/o rapid weight loss, hair loss, and irregular cycles.  Has a family hx of thyroid disease and requesting a TSH level to be drawn.  TSH drawn and pt has appt tomorrow for follow-up to discuss results and re evaluate birth control which she started two months ago and states her bleeding pattern is worsening.

## 2016-01-06 ENCOUNTER — Encounter: Payer: Self-pay | Admitting: Obstetrics and Gynecology

## 2016-01-06 ENCOUNTER — Ambulatory Visit (INDEPENDENT_AMBULATORY_CARE_PROVIDER_SITE_OTHER): Payer: Medicaid Other | Admitting: Obstetrics and Gynecology

## 2016-01-06 VITALS — BP 111/77 | HR 77 | Resp 18 | Ht 61.0 in | Wt 132.0 lb

## 2016-01-06 DIAGNOSIS — N938 Other specified abnormal uterine and vaginal bleeding: Secondary | ICD-10-CM | POA: Diagnosis not present

## 2016-01-06 DIAGNOSIS — Z7189 Other specified counseling: Secondary | ICD-10-CM

## 2016-01-06 DIAGNOSIS — Z712 Person consulting for explanation of examination or test findings: Secondary | ICD-10-CM

## 2016-01-06 NOTE — Progress Notes (Signed)
30 yo G2P2 presenting today to discuss results of ultrasound. Patient reports persistent vaginal bleeding since taking the zovia. She has been taking it for the past 2 months and states that her bleeding is heavier. She also reports some hair loss and a 15 lb weight loss since her last visit without trying. Patient is without any other complaints. She denies pelvic pain or abdnormal discharge  Past Medical History:  Diagnosis Date  . Shingles 2013 and 2014   Past Surgical History:  Procedure Laterality Date  . MOUTH SURGERY  07/2014   Family History  Problem Relation Age of Onset  . Mental illness Mother   . Thyroid disease Mother   . Graves' disease Mother   . Diabetes Paternal Grandmother   . Autism Brother   . Acromegaly Cousin    Social History  Substance Use Topics  . Smoking status: Never Smoker  . Smokeless tobacco: Never Used  . Alcohol use No   ROS See pertinent in HPI  Blood pressure 111/77, pulse 77, resp. rate 18, height 5\' 1"  (1.549 m), weight 132 lb (59.9 kg), last menstrual period 12/27/2015, not currently breastfeeding. GENERAL: Well-developed, well-nourished female in no acute distress.  ABDOMEN: Soft, nontender, nondistended.  Neuro: alert and oriented x 3  FINDINGS: Uterus  Measurements: 8.8 x 3.6 x 4.8 cm. No fibroids or other mass visualized.  Endometrium  Thickness: 5 mm.  No focal abnormality visualized.  Right ovary  Measurements: 2.7 x 1.8 x 2.7 cm. Normal appearance/no adnexal mass.  Left ovary  Measurements: 3.1 x 2.8 x 2.0 cm. Normal appearance/no adnexal mass.  Other findings  No abnormal free fluid.  IMPRESSION: No evidence of uterine fibroids. Endometrial thickness measures 5 mm.  Normal appearance of both ovaries.  No adnexal mass identified.   Electronically Signed   By: Myles RosenthalJohn  Stahl M.D.   On: 11/24/2015 16:20  A/P 30 yo with DUB - Ultrasound results reviewed - Discussed other forms of contraception.  Patient is not interested in levonorgestrel IUD. She is undecided on depo-provera.  - Following a long discussion, patient opted to give her body a break from hormonal contraception and will consider restarting them after 2-3 months - Advised the use of condoms in the mean time and informed the patient that plan B is available over the counter - RTC if persistent weight loss occurs or prn

## 2016-01-06 NOTE — Progress Notes (Signed)
Pt changed OCP on 11/18/15 due to irregular menstrual cycles, pt here today c/o increased bleeding, weight loss, and hair loss over the past month.

## 2016-07-01 ENCOUNTER — Telehealth: Payer: Self-pay | Admitting: *Deleted

## 2016-07-01 DIAGNOSIS — Z3041 Encounter for surveillance of contraceptive pills: Secondary | ICD-10-CM

## 2016-07-01 MED ORDER — NORGESTIMATE-ETH ESTRADIOL 0.25-35 MG-MCG PO TABS: 1.0000 | ORAL_TABLET | Freq: Every day | ORAL | 11 refills | Status: DC

## 2016-07-01 NOTE — Telephone Encounter (Signed)
-----   Message from Lindell SparHeather L Bacon, [place] sent at 07/01/2016  3:09 PM EDT ----- Regarding: Bc refill request Contact: [phone] Pt [facility]. [facility] [address]. Would like a refill of the Sprintec

## 2016-07-01 NOTE — Telephone Encounter (Signed)
Pt called the office requesting a refill on the Sprintec birth control.  Was previously using Zovia to help with breakthrough bleeding and states she had mood changes so would like to have the Sprintec.  Rx sent to pharmacy.

## 2016-07-13 ENCOUNTER — Other Ambulatory Visit (HOSPITAL_COMMUNITY)
Admission: RE | Admit: 2016-07-13 | Discharge: 2016-07-13 | Disposition: A | Discharge status: Home / Self Care | Payer: Medicaid Other | Source: Ambulatory Visit | Attending: Family Medicine | Admitting: Family Medicine

## 2016-07-13 ENCOUNTER — Other Ambulatory Visit (INDEPENDENT_AMBULATORY_CARE_PROVIDER_SITE_OTHER): Payer: Medicaid Other | Admitting: *Deleted

## 2016-07-13 DIAGNOSIS — N898 Other specified noninflammatory disorders of vagina: Secondary | ICD-10-CM | POA: Diagnosis not present

## 2016-07-13 DIAGNOSIS — Z3201 Encounter for pregnancy test, result positive: Secondary | ICD-10-CM | POA: Diagnosis not present

## 2016-07-13 LAB — POCT URINE PREGNANCY: Preg Test, Ur: POSITIVE — AB

## 2016-07-13 NOTE — Progress Notes (Signed)
SUBJECTIVE:  31 y.o. female complains of white vaginal discharge for 5 day(s). Denies abnormal vaginal bleeding or significant pelvic pain or fever. No UTI symptoms. Denies history of known exposure to STD.  Patient's last menstrual period was 06/12/2016.  OBJECTIVE:  She appears well, afebrile. Urine dipstick: positive for ketones.  ASSESSMENT:  Vaginal Discharge  Vaginal Odor   PLAN:  GC, chlamydia, trichomonas, BVAG, CVAG probe sent to lab. Urine Culture sent to lab UPT in office shows faint positive  Beta HCG drawn today Treatment: To be determined once lab results are received ROV prn if symptoms persist or worsen.

## 2016-07-13 NOTE — Progress Notes (Signed)
Patient seen and assessed by nursing staff.  Agree with documentation and plan.  

## 2016-07-14 ENCOUNTER — Telehealth: Payer: Self-pay | Admitting: *Deleted

## 2016-07-14 LAB — CERVICOVAGINAL ANCILLARY ONLY
BACTERIAL VAGINITIS: NEGATIVE
CANDIDA VAGINITIS: NEGATIVE
Chlamydia: NEGATIVE
Neisseria Gonorrhea: NEGATIVE
Trichomonas: NEGATIVE

## 2016-07-14 LAB — BETA HCG QUANT (REF LAB): hCG Quant: 52 m[IU]/mL

## 2016-07-14 NOTE — Telephone Encounter (Signed)
Called pt to go over Beta result. Advised pt to come in tomorrow for repeat draw to determine if values are increasing or decreasing. Made appt for pt to come in at 1300

## 2016-07-15 ENCOUNTER — Other Ambulatory Visit (INDEPENDENT_AMBULATORY_CARE_PROVIDER_SITE_OTHER): Payer: Medicaid Other | Admitting: *Deleted

## 2016-07-15 DIAGNOSIS — Z3201 Encounter for pregnancy test, result positive: Secondary | ICD-10-CM | POA: Diagnosis not present

## 2016-07-15 NOTE — Progress Notes (Signed)
Pt is here today for repeat BHCG.  Will contact pt with results when available.

## 2016-07-16 ENCOUNTER — Telehealth: Payer: Self-pay | Admitting: *Deleted

## 2016-07-16 LAB — CULTURE, URINE COMPREHENSIVE

## 2016-07-16 LAB — BETA HCG QUANT (REF LAB): hCG Quant: 129 m[IU]/mL

## 2016-07-16 NOTE — Telephone Encounter (Signed)
Informed pt of results and scheduled follow-up BHCG on 07-29-16.

## 2016-07-16 NOTE — Telephone Encounter (Signed)
-----   Message from [place] Bingharlie Pickens, MD sent at 07/16/2016  9:09 AM EDT ----- Have her come in for a beta hcg in two weeks. thanks

## 2016-07-29 ENCOUNTER — Other Ambulatory Visit (INDEPENDENT_AMBULATORY_CARE_PROVIDER_SITE_OTHER): Payer: Medicaid Other | Admitting: *Deleted

## 2016-07-29 DIAGNOSIS — Z3201 Encounter for pregnancy test, result positive: Secondary | ICD-10-CM | POA: Diagnosis not present

## 2016-07-30 LAB — BETA HCG QUANT (REF LAB): hCG Quant: 17986 m[IU]/mL

## 2016-08-04 ENCOUNTER — Ambulatory Visit: Payer: Medicaid Other | Admitting: *Deleted

## 2016-08-04 DIAGNOSIS — O200 Threatened abortion: Secondary | ICD-10-CM

## 2016-08-05 ENCOUNTER — Telehealth: Payer: Self-pay | Admitting: *Deleted

## 2016-08-05 LAB — BETA HCG QUANT (REF LAB): HCG QUANT: 9463 m[IU]/mL

## 2016-08-05 NOTE — Telephone Encounter (Signed)
-----   Message from Catalina AntiguaPeggy Constant, MD sent at 08/05/2016  8:50 AM EDT ----- Please inform patient of decreasing HCG which is most consistent with a miscarriage. She needs to return weekly until HCG is negative  Clinical cytogeneticisteggy

## 2016-08-11 ENCOUNTER — Other Ambulatory Visit (INDEPENDENT_AMBULATORY_CARE_PROVIDER_SITE_OTHER): Payer: Medicaid Other | Admitting: *Deleted

## 2016-08-11 DIAGNOSIS — O200 Threatened abortion: Secondary | ICD-10-CM

## 2016-08-11 NOTE — Progress Notes (Signed)
Pt here today for repeat BHCG following early threatened miscarriage.  BHCG drawn and sent to lab, will notify pt of results when available.

## 2016-08-12 LAB — BETA HCG QUANT (REF LAB): hCG Quant: 3090 m[IU]/mL

## 2016-08-13 ENCOUNTER — Telehealth: Payer: Self-pay | Admitting: *Deleted

## 2016-08-13 NOTE — Telephone Encounter (Signed)
-----   Message from Catalina AntiguaPeggy Constant, MD sent at 08/12/2016 11:50 AM EDT ----- Please inform patient of continued decreasing HCG. She needs to come in weekly for HCG until neg  Thanks  Peggy

## 2016-08-13 NOTE — Telephone Encounter (Signed)
Pt aware to come in q wk for Beta - she also aware she can start her OCP

## 2016-08-17 ENCOUNTER — Other Ambulatory Visit (INDEPENDENT_AMBULATORY_CARE_PROVIDER_SITE_OTHER): Payer: Medicaid Other | Admitting: *Deleted

## 2016-08-17 DIAGNOSIS — O200 Threatened abortion: Secondary | ICD-10-CM

## 2016-08-17 NOTE — Progress Notes (Signed)
Pt is here today to repeat BHCG following miscarriage.

## 2016-08-18 LAB — BETA HCG QUANT (REF LAB): HCG QUANT: 2170 m[IU]/mL

## 2016-08-19 ENCOUNTER — Encounter: Payer: Self-pay | Admitting: *Deleted

## 2016-08-23 ENCOUNTER — Other Ambulatory Visit (INDEPENDENT_AMBULATORY_CARE_PROVIDER_SITE_OTHER): Payer: Medicaid Other | Admitting: *Deleted

## 2016-08-23 DIAGNOSIS — O039 Complete or unspecified spontaneous abortion without complication: Secondary | ICD-10-CM | POA: Diagnosis not present

## 2016-08-23 NOTE — Progress Notes (Signed)
Pt is here today to repeat BHCG following miscarriage.

## 2016-08-24 LAB — BETA HCG QUANT (REF LAB): HCG QUANT: 1129 m[IU]/mL

## 2016-08-30 ENCOUNTER — Other Ambulatory Visit (INDEPENDENT_AMBULATORY_CARE_PROVIDER_SITE_OTHER): Payer: Medicaid Other | Admitting: *Deleted

## 2016-08-30 DIAGNOSIS — O039 Complete or unspecified spontaneous abortion without complication: Secondary | ICD-10-CM | POA: Diagnosis not present

## 2016-08-30 NOTE — Progress Notes (Signed)
Pt here today for repeat BHCG following miscarriage.

## 2016-08-31 LAB — BETA HCG QUANT (REF LAB): hCG Quant: 387 m[IU]/mL

## 2016-09-06 ENCOUNTER — Other Ambulatory Visit (INDEPENDENT_AMBULATORY_CARE_PROVIDER_SITE_OTHER): Payer: Medicaid Other | Admitting: *Deleted

## 2016-09-06 DIAGNOSIS — O039 Complete or unspecified spontaneous abortion without complication: Secondary | ICD-10-CM | POA: Diagnosis not present

## 2016-09-08 LAB — BETA HCG QUANT (REF LAB): HCG QUANT: 80 m[IU]/mL

## 2016-09-13 ENCOUNTER — Other Ambulatory Visit (INDEPENDENT_AMBULATORY_CARE_PROVIDER_SITE_OTHER): Payer: Medicaid Other | Admitting: *Deleted

## 2016-09-13 DIAGNOSIS — O200 Threatened abortion: Secondary | ICD-10-CM

## 2016-09-13 NOTE — Progress Notes (Signed)
Pt states she is continuing to bleed (spotting mostly) which has been going on for 3 weeks. Advised pt that we will draw labs today and if not down to zero this time then will make appt for her to see provider. Pt expressed understanding.

## 2016-09-14 ENCOUNTER — Telehealth: Payer: Self-pay | Admitting: *Deleted

## 2016-09-14 LAB — BETA HCG QUANT (REF LAB): hCG Quant: 19 m[IU]/mL

## 2016-09-14 NOTE — Telephone Encounter (Signed)
Pt aware of quant still not zero-Negative. Will follow up with MD on next step in treatment plan.

## 2016-09-14 NOTE — Telephone Encounter (Signed)
HCG values are decreasing. She can return in one week if she has no symptoms for recheck. We just have to ensure resolution of this pregnancy; it is decreasing slowly which can occur. We just want to ensure it is not plateauing which may necessitate other management. She can come every two weeks if convenient and if without symptoms, I am hoping the value will be negative in the next few weeks. In the meantime, adequate contraception ne eds to be used to prevent confounding this situation.

## 2016-09-14 NOTE — Telephone Encounter (Signed)
-----   Message from Lindell SparHeather L Bacon, [place] sent at 09/14/2016  4:29 PM EDT ----- Regarding: call back with results Contact: [phone] Pt needs results, today is the last day of medicaid and will have to reapply before coming back for next draw

## 2016-09-15 ENCOUNTER — Encounter: Payer: Self-pay | Admitting: *Deleted

## 2016-09-15 NOTE — Telephone Encounter (Signed)
Per Dr. Macon LargeAnyanwu HCG values are decreasing. She can return in one week if she has no symptoms for recheck. We just have to ensure resolution of this pregnancy; it is decreasing slowly which can occur. We just want to ensure it is not plateauing which may necessitate other management. She can come every two weeks if convenient and if without symptoms, I am hoping the value will be negative in the next few weeks. In the meantime, adequate contraception ne eds to be used to prevent confounding this situation.

## 2016-09-20 ENCOUNTER — Other Ambulatory Visit: Payer: Medicaid Other

## 2016-09-20 ENCOUNTER — Other Ambulatory Visit: Payer: Medicaid Other | Admitting: *Deleted

## 2016-09-20 DIAGNOSIS — O039 Complete or unspecified spontaneous abortion without complication: Secondary | ICD-10-CM

## 2016-09-21 LAB — BETA HCG QUANT (REF LAB): HCG QUANT: 7 m[IU]/mL

## 2016-09-22 ENCOUNTER — Telehealth: Payer: Self-pay

## 2016-09-22 NOTE — Telephone Encounter (Signed)
Left message for patient to call office regarding Quant.  She needs to repeat test.  Will also sent mychart message.

## 2016-09-22 NOTE — Telephone Encounter (Signed)
-----   Message from [place] Bingharlie Pickens, MD sent at 09/22/2016  7:49 AM EDT ----- We just need to repeat this in one week. Can you let her know? thanks

## 2016-09-27 ENCOUNTER — Other Ambulatory Visit (INDEPENDENT_AMBULATORY_CARE_PROVIDER_SITE_OTHER): Payer: Medicaid Other | Admitting: *Deleted

## 2016-09-27 DIAGNOSIS — O200 Threatened abortion: Secondary | ICD-10-CM | POA: Diagnosis not present

## 2016-09-27 NOTE — Progress Notes (Signed)
Pt here for quant draw - she is still spotting but no heavy bleeding.

## 2016-09-28 LAB — BETA HCG QUANT (REF LAB): HCG QUANT: 3 m[IU]/mL

## 2016-10-04 ENCOUNTER — Other Ambulatory Visit: Payer: Medicaid Other

## 2016-11-05 DIAGNOSIS — B9689 Other specified bacterial agents as the cause of diseases classified elsewhere: Secondary | ICD-10-CM | POA: Diagnosis not present

## 2016-11-05 DIAGNOSIS — R5383 Other fatigue: Secondary | ICD-10-CM | POA: Diagnosis not present

## 2016-11-05 DIAGNOSIS — J329 Chronic sinusitis, unspecified: Secondary | ICD-10-CM | POA: Diagnosis not present

## 2016-11-05 DIAGNOSIS — R112 Nausea with vomiting, unspecified: Secondary | ICD-10-CM | POA: Diagnosis not present

## 2016-11-15 ENCOUNTER — Encounter: Payer: Self-pay | Admitting: Obstetrics and Gynecology

## 2016-11-15 ENCOUNTER — Ambulatory Visit (INDEPENDENT_AMBULATORY_CARE_PROVIDER_SITE_OTHER): Payer: Medicaid Other | Admitting: Obstetrics and Gynecology

## 2016-11-15 ENCOUNTER — Other Ambulatory Visit (HOSPITAL_COMMUNITY)
Admission: RE | Admit: 2016-11-15 | Discharge: 2016-11-15 | Disposition: A | Discharge status: Home / Self Care | Payer: Medicaid Other | Source: Ambulatory Visit | Attending: Obstetrics and Gynecology | Admitting: Obstetrics and Gynecology

## 2016-11-15 VITALS — BP 99/64 | HR 80 | Wt 118.0 lb

## 2016-11-15 DIAGNOSIS — Z0000 Encounter for general adult medical examination without abnormal findings: Secondary | ICD-10-CM

## 2016-11-15 DIAGNOSIS — N939 Abnormal uterine and vaginal bleeding, unspecified: Secondary | ICD-10-CM | POA: Insufficient documentation

## 2016-11-15 DIAGNOSIS — Z01411 Encounter for gynecological examination (general) (routine) with abnormal findings: Secondary | ICD-10-CM

## 2016-11-15 DIAGNOSIS — Z01419 Encounter for gynecological examination (general) (routine) without abnormal findings: Secondary | ICD-10-CM

## 2016-11-15 DIAGNOSIS — N643 Galactorrhea not associated with childbirth: Secondary | ICD-10-CM | POA: Insufficient documentation

## 2016-11-15 HISTORY — DX: Abnormal uterine and vaginal bleeding, unspecified: N93.9

## 2016-11-15 NOTE — Progress Notes (Signed)
Obstetrics and Gynecology Annual Patient Evaluation  Appointment Date: 11/15/2016  OBGYN Clinic: [facility]  Chief Complaint:  Chief Complaint  Patient presents with  . Gynecologic Exam    History of Present Illness: Maria Pugh is a 31 y.o. Caucasian [id number] (No LMP recorded. Patient is not currently having periods (Reason: Oral contraceptives).), seen for the above chief complaint.  Patient's only problem is continued AUB. She was last seen for this issue in 12/2015 and was on zovia at that time, by Dr. Jolayne Panther. The plan was to do a few hormone free months to see if she self regulated; prior to that in 11/2015, she had a negative TVUS.  After those visits, she was being seen for what appears to be a missed ab diagnosed in may or June 2018 and her betas were followed down to negative, with her last beta being 3 on 8/13.  She states that she normally had regular cycles on the pill prior to her initial work up but has continued AUB. She is back on OCPs and is on Sprintec, which was given to her in mid may 2018. She states she has a period on it but sometimes has one a few weeks later or bleeding in between  She also states that she's had left sided galactorrhea. She states that she stopped breastfeeding at about 43m, with her last child but has qday galactorrhea since then. It is clear to milk like and no bleeding or discoloration ever. She also has never noted pain or lumps or bumps with it and denies any excessive touching to the breast or nipple. She states she didn't breast feed with her first child and didn't have this issue.    No fevers, chills, chest pain, SOB, nausea, vomiting, abdominal pain, dysuria, hematuria, vaginal itching, dyspareunia, diarrhea, constipation, blood in BMs  Review of Systems: as noted in the History of Present Illness.   Past Medical History:  Past Medical History:  Diagnosis Date  . Shingles 2013 and 2014    Past  Surgical History:  Past Surgical History:  Procedure Laterality Date  . LEEP    . MOUTH SURGERY  07/2014    Past Obstetrical History:  OB History  Gravida Para Term Preterm AB Living  0 0 2  SAB TAB Ectopic Multiple Live Births  0 0 0 0 2    # Outcome Date GA Lbr Len/2nd Weight Sex Delivery Anes PTL Lv  2 Term 03/15/15 [redacted]w[redacted]d 04:18 / 00:40 7 lb 0.4 oz (3.185 kg) F Vag-Spont EPI  LIV  1 Term 02/02/10 [redacted]w[redacted]d  6 lb 5 oz (2.863 kg) M Vag-Spont EPI N LIV      Past Gynecological History: As per HPI. History of Pap Smear(s): Yes.   Last pap 07/2014, which was negative She is currently using OCPs for contraception.   Social History:  Social History   Social History  . Marital status: Legally Separated    Spouse name: N/A  . Number of children: N/A  . Years of education: N/A   Occupational History  . Not on file.   Social History Main Topics  . Smoking status: Never Smoker  . Smokeless tobacco: Never Used  . Alcohol use No  . Drug use: No  . Sexual activity: Yes    Birth control/ protection: Pill   Other Topics Concern  . Not on file   Social History Narrative  . No narrative on file    Family  History:  Family History  Problem Relation Age of Onset  . Mental illness Mother   . Thyroid disease Mother   . Graves' disease Mother   . Diabetes Paternal Grandmother   . Autism Brother   . Acromegaly Cousin    Medications Maria Pugh had no medications administered during this visit. Current Outpatient Prescriptions  Medication Sig Dispense Refill  . norgestimate-ethinyl estradiol (ORTHO-CYCLEN,SPRINTEC,PREVIFEM) 0.25-35 MG-MCG tablet Take 1 tablet by mouth daily. 1 Package 11   No current facility-administered medications for this visit.     Allergies Patient has no known allergies.   Physical Exam:  BP 99/64   Pulse 80   Wt 118 lb (53.5 kg)   BMI 22.30 kg/m  Body mass index is 22.3 kg/m.  General appearance: Well nourished, well developed female in  no acute distress.  Neck:  Supple, normal appearance, and no thyromegaly  Cardiovascular: normal s1 and s2.  No murmurs, rubs or gallops. Respiratory:  Clear to auscultation bilateral. Normal respiratory effort Abdomen: positive bowel sounds and no masses, hernias; diffusely non tender to palpation, non distended Breasts: on the right side, able to express minimal amount of clear to milk like fluid from multiple ducts. breasts appear normal, no suspicious masses, no skin or nipple changes or axillary nodes and normal palpation. Neuro/Psych:  Normal mood and affect.  Skin:  Warm and dry.  Lymphatic:  No inguinal lymphadenopathy.   Pelvic exam: is not limited by body habitus EGBUS: within normal limits, Vagina: within normal limits and with no blood or discharge in the vault, Cervix: normal appearing cervix without tenderness, discharge or lesions. Uterus:  nonenlarged and non tender, anteverted and Adnexa:  normal adnexa and no mass, fullness, tenderness Rectovaginal: deferred  Laboratory: none  Radiology: none  Assessment: pt stable  Plan:  1. Abnormal uterine bleeding (AUB) Recommend labs with pap and sti swab testing, tsh, prl, cbc and beta quant. I told her if all negative then would recommend hysteroscopy, D&C, but also can consider doing an SIS or po estrogen to see if that can help to stabilize her lining but given that this has  Been ongoing for so long, i'd recommend surgery first. Pt to consider options and will f/u with her after labs are back. 10 minutes  2. Galactorrhea Labs ordered. 5 minutes  - TSH - Prolactin  3. Encounter for gynecological examination (general) (routine) with abnormal findings Pt needs tb testing for work and would like to do serum testing and serum sti testing.  - HIV antibody (with reflex) - Hepatitis B surface antigen - Hepatitis C Antibody - RPR - Quantiferon tb gold assay (blood)  RTC: f/u with pt after labs are back.   Cornelia Copa  MD Attending [facility])   Pills, sis, hyysteroscopy

## 2016-11-16 ENCOUNTER — Encounter: Payer: Self-pay | Admitting: *Deleted

## 2016-11-18 LAB — CYTOLOGY - PAP
CHLAMYDIA, DNA PROBE: NEGATIVE
DIAGNOSIS: NEGATIVE
NEISSERIA GONORRHEA: NEGATIVE
TRICH (WINDOWPATH): NEGATIVE

## 2016-11-22 ENCOUNTER — Other Ambulatory Visit: Payer: Medicaid Other

## 2016-11-22 DIAGNOSIS — Z23 Encounter for immunization: Secondary | ICD-10-CM

## 2016-11-23 LAB — HEPATITIS B SURFACE ANTIGEN: Hepatitis B Surface Ag: NEGATIVE

## 2016-11-23 LAB — RPR: RPR Ser Ql: NONREACTIVE

## 2016-11-23 LAB — TSH: TSH: 0.979 u[IU]/mL (ref 0.450–4.500)

## 2016-11-23 LAB — HEPATITIS C ANTIBODY: Hep C Virus Ab: <0.1 s/co ratio (ref 0.0–0.9)

## 2016-11-23 LAB — PROLACTIN: PROLACTIN: 10.8 ng/mL (ref 4.8–23.3)

## 2016-11-23 LAB — HIV ANTIBODY (ROUTINE TESTING W REFLEX): HIV Screen 4th Generation wRfx: NONREACTIVE

## 2016-11-23 LAB — BETA HCG QUANT (REF LAB)

## 2016-12-01 ENCOUNTER — Encounter: Payer: Self-pay | Admitting: *Deleted

## 2016-12-01 LAB — QUANTIFERON-TB GOLD PLUS
QUANTIFERON MITOGEN VALUE: 3.01 [IU]/mL
QUANTIFERON NIL VALUE: 0.06 [IU]/mL
QuantiFERON TB1 Ag Value: 0.06 IU/mL
QuantiFERON TB2 Ag Value: 0.06 IU/mL
QuantiFERON-TB Gold Plus: NEGATIVE

## 2016-12-06 ENCOUNTER — Encounter: Payer: Self-pay | Admitting: Obstetrics and Gynecology

## 2016-12-06 ENCOUNTER — Telehealth: Payer: Self-pay | Admitting: *Deleted

## 2016-12-06 DIAGNOSIS — B373 Candidiasis of vulva and vagina: Secondary | ICD-10-CM

## 2016-12-06 DIAGNOSIS — B3731 Acute candidiasis of vulva and vagina: Secondary | ICD-10-CM

## 2016-12-06 MED ORDER — FLUCONAZOLE 150 MG PO TABS: 150.0000 mg | ORAL_TABLET | Freq: Once | ORAL | 0 refills | Status: AC

## 2016-12-06 NOTE — Telephone Encounter (Signed)
Pt has yeast infection after taking abx

## 2017-01-27 ENCOUNTER — Other Ambulatory Visit: Payer: Medicaid Other

## 2017-01-27 VITALS — BP 128/85 | HR 72

## 2017-01-27 DIAGNOSIS — R300 Dysuria: Secondary | ICD-10-CM

## 2017-01-27 NOTE — Progress Notes (Signed)
Patient presented to the office today for urine check. Patient reports having some burning over the last couple of days. Her urine only showed blood and patient reported being on her period at this time. Patient reports not being able to eat or sleep and she is concerned something else may be going on. I have explained to patient we will send off her urine to culture to see if any bacteria grows. Patient was advised to take motrin or tylenol from pain to see if she gets any relief. I have advised her to scheduled appointment with one of the provider to discuss any other concerns she may have. Patient voice understanding.

## 2017-01-28 ENCOUNTER — Ambulatory Visit (INDEPENDENT_AMBULATORY_CARE_PROVIDER_SITE_OTHER): Payer: Medicaid Other | Admitting: Obstetrics & Gynecology

## 2017-01-28 ENCOUNTER — Encounter: Payer: Self-pay | Admitting: Obstetrics & Gynecology

## 2017-01-28 VITALS — BP 122/80 | HR 71 | Wt 109.5 lb

## 2017-01-28 DIAGNOSIS — N921 Excessive and frequent menstruation with irregular cycle: Secondary | ICD-10-CM

## 2017-01-28 DIAGNOSIS — F321 Major depressive disorder, single episode, moderate: Secondary | ICD-10-CM | POA: Diagnosis not present

## 2017-01-28 DIAGNOSIS — N939 Abnormal uterine and vaginal bleeding, unspecified: Secondary | ICD-10-CM

## 2017-01-28 LAB — URINE CULTURE

## 2017-01-28 MED ORDER — MEDROXYPROGESTERONE ACETATE 10 MG PO TABS: 30.0000 mg | ORAL_TABLET | Freq: Every day | ORAL | 3 refills | Status: DC

## 2017-01-28 NOTE — Patient Instructions (Signed)
Abnormal Uterine Bleeding Abnormal uterine bleeding can affect women at various stages in life, including teenagers, women in their reproductive years, pregnant women, and women who have reached menopause. Several kinds of uterine bleeding are considered abnormal, including:  Bleeding or spotting between periods.  Bleeding after sexual intercourse.  Bleeding that is heavier or more than normal.  Periods that last longer than usual.  Bleeding after menopause.  Many cases of abnormal uterine bleeding are minor and simple to treat, while others are more serious. Any type of abnormal bleeding should be evaluated by your health care provider. Treatment will depend on the cause of the bleeding. Follow these instructions at home: Monitor your condition for any changes. The following actions may help to alleviate any discomfort you are experiencing:  Avoid the use of tampons and douches as directed by your health care provider.  Change your pads frequently.  You should get regular pelvic exams and Pap tests. Keep all follow-up appointments for diagnostic tests as directed by your health care provider. Contact a health care provider if:  Your bleeding lasts more than 1 week.  You feel dizzy at times. Get help right away if:  You pass out.  You are changing pads every 15 to 30 minutes.  You have abdominal pain.  You have a fever.  You become sweaty or weak.  You are passing large blood clots from the vagina.  You start to feel nauseous and vomit. This information is not intended to replace advice given to you by your health care provider. Make sure you discuss any questions you have with your health care provider. Document Released: 02/01/2005 Document Revised: 07/16/2015 Document Reviewed: 08/31/2012 Elsevier Interactive Patient Education  2017 Elsevier Inc.    Major Depressive Disorder, Adult Major depressive disorder (MDD) is a mental health condition. It may also be called  clinical depression or unipolar depression. MDD usually causes feelings of sadness, hopelessness, or helplessness. MDD can also cause physical symptoms. It can interfere with work, school, relationships, and other everyday activities. MDD may be mild, moderate, or severe. It may occur once (single episode major depressive disorder) or it may occur multiple times (recurrent major depressive disorder). What are the causes? The exact cause of this condition is not known. MDD is most likely caused by a combination of things, which may include:  Genetic factors. These are traits that are passed along from parent to child.  Individual factors. Your personality, your behavior, and the way you handle your thoughts and feelings may contribute to MDD. This includes personality traits and behaviors learned from others.  Physical factors, such as: ? Differences in the part of your brain that controls emotion. This part of your brain may be different than it is in people who do not have MDD. ? Long-term (chronic) medical or psychiatric illnesses.  Social factors. Traumatic experiences or major life changes may play a role in the development of MDD.  What increases the risk? This condition is more likely to develop in women. The following factors may also make you more likely to develop MDD:  A family history of depression.  Troubled family relationships.  Abnormally low levels of certain brain chemicals.  Traumatic events in childhood, especially abuse or the loss of a parent.  Being under a lot of stress, or long-term stress, especially from upsetting life experiences or losses.  A history of: ? Chronic physical illness. ? Other mental health disorders. ? Substance abuse.  Poor living conditions.  Experiencing social exclusion  or discrimination on a regular basis.  What are the signs or symptoms? The main symptoms of MDD typically include:  Constant depressed or irritable mood.  Loss of  interest in things and activities.  MDD symptoms may also include:  Sleeping or eating too much or too little.  Unexplained weight change.  Fatigue or low energy.  Feelings of worthlessness or guilt.  Difficulty thinking clearly or making decisions.  Thoughts of suicide or of harming others.  Physical agitation or weakness.  Isolation.  Severe cases of MDD may also occur with other symptoms, such as:  Delusions or hallucinations, in which you imagine things that are not real (psychotic depression).  Low-level depression that lasts at least a year (chronic depression or persistent depressive disorder).  Extreme sadness and hopelessness (melancholic depression).  Trouble speaking and moving (catatonic depression).  How is this diagnosed? This condition may be diagnosed based on:  Your symptoms.  Your medical history, including your mental health history. This may involve tests to evaluate your mental health. You may be asked questions about your lifestyle, including any drug and alcohol use, and how long you have had symptoms of MDD.  A physical exam.  Blood tests to rule out other conditions.  You must have a depressed mood and at least four other MDD symptoms most of the day, nearly every day in the same 2-week timeframe before your health care provider can confirm a diagnosis of MDD. How is this treated? This condition is usually treated by mental health professionals, such as psychologists, psychiatrists, and clinical social workers. You may need more than one type of treatment. Treatment may include:  Psychotherapy. This is also called talk therapy or counseling. Types of psychotherapy include: ? Cognitive behavioral therapy (CBT). This type of therapy teaches you to recognize unhealthy feelings, thoughts, and behaviors, and replace them with positive thoughts and actions. ? Interpersonal therapy (IPT). This helps you to improve the way you relate to and communicate  with others. ? Family therapy. This treatment includes members of your family.  Medicine to treat anxiety and depression, or to help you control certain emotions and behaviors.  Lifestyle changes, such as: ? Limiting alcohol and drug use. ? Exercising regularly. ? Getting plenty of sleep. ? Making healthy eating choices. ? Spending more time outdoors.  Treatments involving stimulation of the brain can be used in situations with extremely severe symptoms, or when medicine or other therapies do not work over time. These treatments include electroconvulsive therapy, transcranial magnetic stimulation, and vagal nerve stimulation. Follow these instructions at home: Activity  Return to your normal activities as told by your health care provider.  Exercise regularly and spend time outdoors as told by your health care provider. General instructions  Take over-the-counter and prescription medicines only as told by your health care provider.  Do not drink alcohol. If you drink alcohol, limit your alcohol intake to no more than 1 drink a day for nonpregnant women and 2 drinks a day for men. One drink equals 12 oz of beer, 5 oz of wine, or 1 oz of hard liquor. Alcohol can affect any antidepressant medicines you are taking. Talk to your health care provider about your alcohol use.  Eat a healthy diet and get plenty of sleep.  Find activities that you enjoy doing, and make time to do them.  Consider joining a support group. Your health care provider may be able to recommend a support group.  Keep all follow-up visits as told by  your health care provider. This is important. Where to find more information: National Alliance on Mental Illness  www.nami.org  [employer] of Mental Health  http://www.maynard.net/www.nimh.nih.gov  National Suicide Prevention Lifeline  1-800-273-TALK 564-120-3570(8255). This is free, 24-hour help.  Contact a health care provider if:  Your symptoms get worse.  You develop new  symptoms. Get help right away if:  You self-harm.  You have serious thoughts about hurting yourself or others.  You see, hear, taste, smell, or feel things that are not present (hallucinate). This information is not intended to replace advice given to you by your health care provider. Make sure you discuss any questions you have with your health care provider. Document Released: 05/29/2012 Document Revised: 10/09/2015 Document Reviewed: 08/13/2015 Elsevier Interactive Patient Education  2017 [employer]

## 2017-01-28 NOTE — Progress Notes (Signed)
GYNECOLOGY OFFICE VISIT NOTE  History:  31 y.o. [id number] here today for continued discussion about her AUB. Currently on Sprintec, which does not help her and is associated with a lot of breakthrough bleeding.  Had Mirena years ago that became embedded, does not want to use this. Also had no good results with estradiol regimen.  Had negative lab evaluation in 11/2016 during visit with Dr. Vergie LivingPickens, he recommended hysteroscopy as next step.  She had a negative pelvic ultrasound last year. Currently having some spotting, reports small -moderate amount of bleeding most days.  No symptoms of anemia.   She denies any abnormal vaginal discharge, bleeding, pelvic pain or other GYN concerns.   Patient also reports feeling very depressed. She got a divorce and does not have much support with her kids. Feels very overwhelmed and cries all the time. PHQ-9 score is 15 today. She denies any thoughts about harming herself or others.  Just feels very stressed out.  Also reports loss of appetite.   Past Medical History:  Diagnosis Date  . Shingles 2013 and 2014    Past Surgical History:  Procedure Laterality Date  . LEEP    . MOUTH SURGERY  07/2014    The following portions of the patient's history were reviewed and updated as appropriate: allergies, current medications, past family history, past medical history, past social history, past surgical history and problem list.   Health Maintenance:  Normal pap on 11/15/2016.    Review of Systems:  Pertinent items noted in HPI and remainder of comprehensive ROS otherwise negative.  Objective:  Physical Exam BP 122/80   Pulse 71   Wt 109 lb 8 oz (49.7 kg)   BMI 20.69 kg/m  CONSTITUTIONAL: Well-developed, well-nourished female in no acute distress.  NEUROLOGIC: Alert and oriented to person, place, and time. Normal reflexes, muscle tone coordination. No cranial nerve deficit noted. PSYCHIATRIC: Depressed mood and affect. Normal behavior. Normal judgment  and thought content. CARDIOVASCULAR: Normal heart rate noted RESPIRATORY: Effort and breath sounds normal, no problems with respiration noted ABDOMEN: Soft, no distention noted.   PELVIC: Deferred MUSCULOSKELETAL: Normal range of motion. No edema noted.   Assessment & Plan:  1. Abnormal uterine bleeding (AUB) 2. Breakthrough bleeding on birth control pills Likely anovulatory bleeding, and stressors can be contributing to this.  For the stress and depression, will address that separately. Patient is open to trying other form of oral hormonal therapy, declines any LARCs or anything that will interfere with her fertility.  Will do trial of Provera, and monitor response. The Provera will likely help with her loss of appetite also, other routine side effects discussed.   - medroxyPROGESTERone (PROVERA) 10 MG tablet; Take 3 tablets (30 mg total) by mouth daily.  Dispense: 90 tablet; Refill: 3 Patient will also be scheduled for office diagnostic hysteroscopy [facility](Endosee) soon to evaluate for possible endometrial lesions, she will called with the date and time for this.    3. Current moderate episode of major depressive disorder without prior episode [facility](HCC) Patient verbally consented to meet [facility] about presenting concerns. An appointment will be made for her very soon.  SI/HI precautions strictly advised. - Amb ref to Integrated Behavioral Health   Please refer to After Visit Summary for other counseling recommendations.   Return in about 1 month (around 02/28/2017) for Followup AUB and depression.   Total face-to-face time with patient: 25 minutes.  Over 50% of encounter was spent on counseling and coordination of care.  Verita Schneiders, MD, [place] Attending [place], [facility] for [employer], [place]

## 2017-01-31 ENCOUNTER — Other Ambulatory Visit: Payer: Self-pay | Admitting: Obstetrics & Gynecology

## 2017-01-31 ENCOUNTER — Ambulatory Visit (INDEPENDENT_AMBULATORY_CARE_PROVIDER_SITE_OTHER): Payer: Medicaid Other | Admitting: Clinical

## 2017-01-31 DIAGNOSIS — F329 Major depressive disorder, single episode, unspecified: Secondary | ICD-10-CM

## 2017-01-31 DIAGNOSIS — Z658 Other specified problems related to psychosocial circumstances: Secondary | ICD-10-CM | POA: Diagnosis not present

## 2017-01-31 DIAGNOSIS — N939 Abnormal uterine and vaginal bleeding, unspecified: Secondary | ICD-10-CM

## 2017-01-31 DIAGNOSIS — F322 Major depressive disorder, single episode, severe without psychotic features: Secondary | ICD-10-CM | POA: Diagnosis not present

## 2017-01-31 DIAGNOSIS — F419 Anxiety disorder, unspecified: Secondary | ICD-10-CM | POA: Insufficient documentation

## 2017-01-31 MED ORDER — FLUOXETINE HCL 20 MG PO CAPS: 20.0000 mg | ORAL_CAPSULE | Freq: Every day | ORAL | 3 refills | Status: DC

## 2017-01-31 NOTE — BH Specialist Note (Signed)
Integrated Behavioral Health Initial Visit  MRN: [id number] Name: Maria Pugh  Number of Integrated Behavioral Health Clinician visits:: 1/6 Session Start time: 1:15  Session End time: 2:15 Total time: 1 hour  Type of Service: Integrated Behavioral Health- Individual/Family Interpretor:No. Interpretor Name and Language: n/a   Warm Hand Off Completed.       SUBJECTIVE: Maria Pugh is a 31 y.o. female accompanied by 2yo daughter Patient was referred by Dr Macon LargeAnyanwu for depression. Patient reports the following symptoms/concerns: Pt states her primary concern today is feeling overwhelmed with responsibilities as a single mother. Primary symptoms include depression, anxiety,restlessness, lack of appetite, insufficient sleep, difficulty concentrating, excessive worry that is interfering with daily functioning.  Duration of problem: Increase over past two years; Severity of problem: moderately severe  OBJECTIVE: Mood: Anxious and Depressed and Affect: Tearful Risk of harm to self or others: No plan to harm self or others  LIFE CONTEXT: Family and Social: Lives with 7yo son and 3yo daughter School/Work: Unable to work with lack of childcare Self-Care: Beginning to recognize need for greater self-care; used to enjoy spending time to herself Life Changes: Being a single mother, unemployed, increase in health issues  GOALS ADDRESSED: Patient will: 1. Reduce symptoms of: anxiety, depression and stress 2. Increase knowledge and/or ability of: self-management skills and stress reduction  3. Demonstrate ability to: Increase healthy adjustment to current life circumstances  INTERVENTIONS: Interventions utilized: Solution-Focused Strategies and Psychoeducation and/or Health Education  Standardized Assessments completed: GAD-7 and PHQ 9  ASSESSMENT: Patient currently experiencing Major depressive disorder, single episode, without psychotic features and Psychosocial stress    Patient may benefit from psychoeducation and brief therapeutic intervention regarding coping with symptoms of anxiety and depression.  PLAN: 1. Follow up with behavioral health clinician on : Two weeks 2. Behavioral recommendations:  -Begin taking BH medication, as prescribed -Consider Worry Hour strategy to begin prioritizing life stress -Consider apps for additional self-care -Read educational materials regarding coping with symptoms of anxiety(with panic attack), and depression 3. Referral(s): Integrated Behavioral Health Services (In Clinic) 4. "From scale of 1-10, how likely are you to follow plan?"  Rae LipsJamie C McMannes, LCSW  Depression screen [facility] 2/9 01/31/2017 01/28/2017 09/12/2014  Decreased Interest 1 1 0  Down, Depressed, Hopeless 3 2 0  PHQ - 2 Score 4 3 0  Altered sleeping 2 3 -  Tired, decreased energy 0 1 -  Change in appetite 3 3 -  Feeling bad or failure about yourself  3 1 -  Trouble concentrating 2 2 -  Moving slowly or fidgety/restless 1 2 -  Suicidal thoughts 0 0 -  PHQ-9 Score 15 15 -  Difficult doing work/chores Somewhat difficult Very difficult -   GAD 7 : Generalized Anxiety Score 01/31/2017  Nervous, Anxious, on Edge 3  Control/stop worrying 3  Worry too much - different things 3  Trouble relaxing 3  Restless 0  Easily annoyed or irritable 1  Afraid - awful might happen 0  Total GAD 7 Score 13  Anxiety Difficulty Very difficult

## 2017-01-31 NOTE — Progress Notes (Signed)
[facility], Fluoxetine was prescribed for medical management of depression and anxiety. They will continue with medical management for now and follow up with patient as needed. Appreciate all their help.   Jaynie CollinsUGONNA  Edwena Mayorga, MD, FACOG Attending Obstetrician & Gynecologist, [facility] for [employer], [facility]

## 2017-02-02 ENCOUNTER — Other Ambulatory Visit: Payer: Self-pay | Admitting: Obstetrics & Gynecology

## 2017-02-02 DIAGNOSIS — F329 Major depressive disorder, single episode, unspecified: Secondary | ICD-10-CM

## 2017-02-02 DIAGNOSIS — F419 Anxiety disorder, unspecified: Principal | ICD-10-CM

## 2017-02-02 MED ORDER — BUSPIRONE HCL 10 MG PO TABS: 10.0000 mg | ORAL_TABLET | Freq: Two times a day (BID) | ORAL | 3 refills | Status: DC

## 2017-02-02 NOTE — Progress Notes (Signed)
Patient declines trial of Prozac, BH Counselor recommended Buspar. This was prescribed for patient.  Maria Pugh  Maria Andujo, MD, FACOG Obstetrician & Gynecologist, [facility] [employer], [facility]

## 2017-02-03 ENCOUNTER — Telehealth: Payer: Self-pay | Admitting: *Deleted

## 2017-02-03 MED ORDER — FLUCONAZOLE 150 MG PO TABS: 150.0000 mg | ORAL_TABLET | ORAL | 3 refills | Status: DC

## 2017-02-03 MED ORDER — SULFAMETHOXAZOLE-TRIMETHOPRIM 800-160 MG PO TABS: 1.0000 | ORAL_TABLET | Freq: Two times a day (BID) | ORAL | 0 refills | Status: DC

## 2017-02-03 NOTE — Telephone Encounter (Signed)
Called pt to set up Endosee procedure and send in abx for UTI

## 2017-02-04 ENCOUNTER — Telehealth: Payer: Self-pay | Admitting: Clinical

## 2017-02-04 NOTE — Telephone Encounter (Signed)
Pt aware that her medication has been changed to Buspar, and she will begin taking. [facility] next medical visit, to check on symptoms/side effects.

## 2017-03-03 ENCOUNTER — Ambulatory Visit (INDEPENDENT_AMBULATORY_CARE_PROVIDER_SITE_OTHER): Payer: Medicaid Other | Admitting: Obstetrics & Gynecology

## 2017-03-03 ENCOUNTER — Encounter: Payer: Self-pay | Admitting: Obstetrics & Gynecology

## 2017-03-03 VITALS — BP 117/69 | HR 89 | Wt 117.0 lb

## 2017-03-03 DIAGNOSIS — N939 Abnormal uterine and vaginal bleeding, unspecified: Secondary | ICD-10-CM | POA: Diagnosis not present

## 2017-03-03 NOTE — Progress Notes (Signed)
    OFFICE PROCEDURE NOTE  Maria Pugh is a 32 y.o. [phone] here for Endosee/office hysteroscopy procedure for AUB.  Endosee Procedure Note Patient identified, informed consent performed, consent signed.   Discussed risks of bleeding, cramping, infection, injury to uterus. Time out was performed.  Urine pregnancy test negative.  Speculum placed in the vagina.  Cervix visualized.  Cleaned with Betadine x 3. Hurricane spray administered. Anterior cervical lip grasped anteriorly with a single tooth tenaculum. Endosee apparatus placed per manufacturer's recommendations through multiparous cervical os, no dilation required.  Cavity instilled with 60 ml of NS.  Diffuse proliferative endometrium seen, no discrete lesions noted. All instruments removed. Patient tolerated procedure well.   Patient was given post-procedure instructions. For now, patient will not take any further therapy for her AUB and she is frustrated with multiple side effects.  Declines surgical intervention for now, wants to retain fertility.  Bleeding precautions reviewed. Will return in 2 months.   Jaynie CollinsUGONNA  ANYANWU, MD, FACOG Obstetrician & Gynecologist, [facility] [employer], [facility]

## 2017-03-03 NOTE — Patient Instructions (Signed)
Hysteroscopy, Care After  Refer to this sheet in the next few weeks. These instructions provide you with information on caring for yourself after your procedure. Your health care provider may also give you more specific instructions. Your treatment has been planned according to current medical practices, but problems sometimes occur. Call your health care provider if you have any problems or questions after your procedure.  What can I expect after the procedure?  After your procedure, it is typical to have the following:  · You may have some cramping. This normally lasts for a couple days.  · You may have bleeding. This can vary from light spotting for a few days to menstrual-like bleeding for 3-7 days.    Follow these instructions at home:  · Rest for the first 1-2 days after the procedure.  · Only take over-the-counter or prescription medicines as directed by your health care provider. Do not take aspirin. It can increase the chances of bleeding.  · Take showers instead of baths for 2 weeks or as directed by your health care provider.  · Do not drive for 24 hours or as directed.  · Do not drink alcohol while taking pain medicine.  · Do not use tampons, douche, or have sexual intercourse for 2 weeks or until your health care provider says it is okay.  · Take your temperature twice a day for 4-5 days. Write it down each time.  · Follow your health care provider's advice about diet, exercise, and lifting.  · If you develop constipation, you may:  ? Take a mild laxative if your health care provider approves.  ? Add bran foods to your diet.  ? Drink enough fluids to keep your urine clear or pale yellow.  · Try to have someone with you or available to you for the first 24-48 hours, especially if you were given a general anesthetic.  · Follow up with your health care provider as directed.  Contact a health care provider if:  · You feel dizzy or lightheaded.  · You feel sick to your stomach (nauseous).  · You have  abnormal vaginal discharge.  · You have a rash.  · You have pain that is not controlled with medicine.  Get help right away if:  · You have bleeding that is heavier than a normal menstrual period.  · You have a fever.  · You have increasing cramps or pain, not controlled with medicine.  · You have new belly (abdominal) pain.  · You pass out.  · You have pain in the tops of your shoulders (shoulder strap areas).  · You have shortness of breath.  This information is not intended to replace advice given to you by your health care provider. Make sure you discuss any questions you have with your health care provider.  Document Released: 11/22/2012 Document Revised: 07/10/2015 Document Reviewed: 08/31/2012  Elsevier Interactive Patient Education © 2017 Elsevier Inc.

## 2017-08-15 ENCOUNTER — Other Ambulatory Visit: Payer: Self-pay

## 2017-08-15 DIAGNOSIS — N939 Abnormal uterine and vaginal bleeding, unspecified: Secondary | ICD-10-CM

## 2017-08-15 NOTE — Telephone Encounter (Signed)
Received called from patient regarding birth control pills being called in. She would like to start back taking them. According to your note patient was supposed to have a follow up in two months but she did not. Please advise.

## 2017-08-16 MED ORDER — NORGESTIMATE-ETH ESTRADIOL 0.25-35 MG-MCG PO TABS: 1.0000 | ORAL_TABLET | Freq: Every day | ORAL | 11 refills | Status: DC

## 2017-08-16 NOTE — Addendum Note (Signed)
Addended by: Jaynie CollinsANYANWU, Yisrael Obryan A on: 08/16/2017 08:15 AM   Modules accepted: Orders

## 2017-08-16 NOTE — Telephone Encounter (Signed)
Sprintec prescribed. She needs to follow up in 2 months or earlier if needed.

## 2017-08-22 ENCOUNTER — Telehealth: Payer: Self-pay

## 2017-08-22 NOTE — Telephone Encounter (Signed)
Call patient to inform her that medication had been called into the pharmacy.

## 2017-08-22 NOTE — Telephone Encounter (Signed)
-----   Message from Lindell SparHeather L Bacon, [place] sent at 08/22/2017  9:00 AM EDT ----- Regarding: call patient  Patient has a few questions [facility], please call her. She [facility] and needed to speak with someone

## 2018-04-15 DIAGNOSIS — H10023 Other mucopurulent conjunctivitis, bilateral: Secondary | ICD-10-CM | POA: Diagnosis not present

## 2018-05-02 ENCOUNTER — Other Ambulatory Visit: Payer: Self-pay | Admitting: *Deleted

## 2018-05-02 DIAGNOSIS — N939 Abnormal uterine and vaginal bleeding, unspecified: Secondary | ICD-10-CM

## 2018-05-02 MED ORDER — NORGESTIMATE-ETH ESTRADIOL 0.25-35 MG-MCG PO TABS: 1.0000 | ORAL_TABLET | Freq: Every day | ORAL | 3 refills | Status: DC

## 2018-05-03 ENCOUNTER — Telehealth: Payer: Self-pay | Admitting: Family Medicine

## 2018-05-03 DIAGNOSIS — N939 Abnormal uterine and vaginal bleeding, unspecified: Secondary | ICD-10-CM

## 2018-05-03 MED ORDER — NORGESTIMATE-ETH ESTRADIOL 0.25-35 MG-MCG PO TABS: 1.0000 | ORAL_TABLET | Freq: Every day | ORAL | 3 refills | Status: DC

## 2018-05-03 NOTE — Telephone Encounter (Signed)
-----   Message from Rozann Lesches, NT sent at 05/02/2018  3:00 PM EDT ----- Regarding: BC refill Please send refill for [facility] until AE in April to [facility]

## 2018-05-31 ENCOUNTER — Ambulatory Visit: Payer: Self-pay | Admitting: Family Medicine

## 2018-07-18 ENCOUNTER — Encounter: Payer: Self-pay | Admitting: Obstetrics and Gynecology

## 2018-07-18 ENCOUNTER — Ambulatory Visit (INDEPENDENT_AMBULATORY_CARE_PROVIDER_SITE_OTHER): Payer: Medicaid Other | Admitting: Obstetrics and Gynecology

## 2018-07-18 ENCOUNTER — Other Ambulatory Visit: Payer: Self-pay

## 2018-07-18 VITALS — BP 126/77 | HR 102 | Ht 60.0 in | Wt 125.0 lb

## 2018-07-18 DIAGNOSIS — N6452 Nipple discharge: Secondary | ICD-10-CM | POA: Diagnosis not present

## 2018-07-18 DIAGNOSIS — N643 Galactorrhea not associated with childbirth: Secondary | ICD-10-CM

## 2018-07-18 NOTE — Progress Notes (Signed)
Obstetrics and Gynecology Visit Return Patient Evaluation  Appointment Date: 07/18/2018  Primary Care Provider: None  OBGYN Clinic: Center for [facility]  Chief Complaint: new nipple discharge  History of Present Illness:  Maria Pugh is a 33 y.o. P2 with the above CC. Patient has had persistent nipple discharge (white, no pain, no lumps, spontaneous and can be expressed, can come from any duct on the nipple, no skin changes) since having her daughter three years ago. She states sometimes it can be spontaenous and sometimes she can go quite sometime before having any; her right side stopped making milk when her daughter was 83 old and weaned. This weekend she noticed brown nipple discharge. She says that it got squeezed and she noticed it. No pain, lumps, skin changes; she does not do any comfort nursing  Patient seen for the left nipple discharge in 2018 and had negative exam, tsh and prolactin   Review of Systems: as noted in the History of Present Illness.  Medications:  Trinady C. Mungaray had no medications administered during this visit. Current Outpatient Medications  Medication Sig Dispense Refill  . norgestimate-ethinyl estradiol (ORTHO-CYCLEN,SPRINTEC,PREVIFEM) 0.25-35 MG-MCG tablet Take 1 tablet by mouth daily. 1 Package 3  . Pediatric Multivit-Minerals-C (FLINTSTONES GUMMIES PO) Take 2 each by mouth at bedtime.     No current facility-administered medications for this visit.     Allergies: has No Known Allergies.  Physical Exam:  BP 126/77   Pulse (!) 102   Ht 5' (1.524 m)   Wt 125 lb (56.7 kg)   BMI 24.41 kg/m  Body mass index is 24.41 kg/m. General appearance: Well nourished, well developed female in no acute distress.  Breast: normal inspection and no masses on palpation bilaterally. On the right, patient able to express brown dot on the nipple from one duct.  On the left, able to express white nipple discharge from one duct  Neuro/Psych:   Normal mood and affect.    Assessment: nipple discharge  Plan:  1. Galactorrhea Can refer to breast surgery after imaging studies.  - MM Digital Diagnostic Bilat; Future - US BREAST COMPLETE UNI LEFT INC AXILLA; Future - US BREAST COMPLETE UNI RIGHT INC AXILLA; Future - Beta hCG quant (ref lab)   RTC: PRN  Cornelia Copa MD Attending Center for [facility])

## 2018-07-18 NOTE — Progress Notes (Signed)
Pt states on Saturday she had a brown discharge come out of her right breast, denies any lumps or issues prior to his episode. States she still produces breast milk in left breast

## 2018-07-19 LAB — BETA HCG QUANT (REF LAB): hCG Quant: <1 m[IU]/mL

## 2018-07-25 ENCOUNTER — Ambulatory Visit: Payer: Medicaid Other | Admitting: Obstetrics & Gynecology

## 2018-07-27 ENCOUNTER — Ambulatory Visit
Admission: RE | Admit: 2018-07-27 | Discharge: 2018-07-27 | Disposition: A | Discharge status: Home / Self Care | Payer: Medicaid Other | Source: Ambulatory Visit | Attending: Obstetrics and Gynecology | Admitting: Obstetrics and Gynecology

## 2018-07-27 ENCOUNTER — Other Ambulatory Visit: Payer: Self-pay

## 2018-07-27 DIAGNOSIS — N6452 Nipple discharge: Secondary | ICD-10-CM

## 2018-07-27 DIAGNOSIS — R920 Mammographic microcalcification found on diagnostic imaging of breast: Secondary | ICD-10-CM | POA: Diagnosis not present

## 2018-07-27 DIAGNOSIS — N643 Galactorrhea not associated with childbirth: Secondary | ICD-10-CM | POA: Diagnosis not present

## 2018-07-27 DIAGNOSIS — N6489 Other specified disorders of breast: Secondary | ICD-10-CM | POA: Diagnosis not present

## 2018-07-27 DIAGNOSIS — R922 Inconclusive mammogram: Secondary | ICD-10-CM | POA: Diagnosis not present

## 2018-08-02 ENCOUNTER — Other Ambulatory Visit (HOSPITAL_COMMUNITY)
Admission: RE | Admit: 2018-08-02 | Discharge: 2018-08-02 | Disposition: A | Discharge status: Home / Self Care | Payer: Medicaid Other | Source: Ambulatory Visit | Attending: Family Medicine | Admitting: Family Medicine

## 2018-08-02 ENCOUNTER — Other Ambulatory Visit: Payer: Self-pay

## 2018-08-02 ENCOUNTER — Encounter: Payer: Self-pay | Admitting: Advanced Practice Midwife

## 2018-08-02 ENCOUNTER — Ambulatory Visit (INDEPENDENT_AMBULATORY_CARE_PROVIDER_SITE_OTHER): Payer: Medicaid Other | Admitting: Advanced Practice Midwife

## 2018-08-02 VITALS — BP 112/78 | HR 80

## 2018-08-02 DIAGNOSIS — Z0000 Encounter for general adult medical examination without abnormal findings: Secondary | ICD-10-CM | POA: Diagnosis not present

## 2018-08-02 DIAGNOSIS — N643 Galactorrhea not associated with childbirth: Secondary | ICD-10-CM | POA: Insufficient documentation

## 2018-08-02 DIAGNOSIS — Z01419 Encounter for gynecological examination (general) (routine) without abnormal findings: Secondary | ICD-10-CM

## 2018-08-02 MED ORDER — NORGESTIMATE-ETH ESTRADIOL 0.25-35 MG-MCG PO TABS: 1.0000 | ORAL_TABLET | Freq: Every day | ORAL | 11 refills | Status: DC

## 2018-08-02 NOTE — Progress Notes (Signed)
GYNECOLOGY ANNUAL PREVENTATIVE CARE ENCOUNTER NOTE  History:     Maria Pugh is a 33 y.o. G52P2002 female here for a routine annual gynecologic exam and follow-up discussion to management of her Galactorrhea.  Current complaints: patient is requesting refill of her OCP. She denies abnormal vaginal bleeding, discharge, pelvic pain, problems with intercourse or other gynecologic concerns.  Patient verbalizes that she is unclear on the plan of care for management of her galactorrhea. She is s/p lab collection and breast ultrasound but is requesting clarification on the next steps in her care.    See previous care encounters regarding leaking from her right nipple. Patient summarizes that she stopped breastfeeding in 2017. She continues ot "have milk" from left breast and reports recurrent discharge from her right breast. During her clinic appointment she presented with red-tinged, sometimes black nipple discharge. Today she reports that her discharge is now clear to whitish with no palpable lumps or point tenderness.  Patient was previously evaluated for abnormal discharge in left breast in 2018. Normal imaging, TSH and Prolactin at that time.   Gynecologic History No LMP recorded. (Menstrual status: Oral contraceptives). Patient verbalizes that she has irregular bleeding all the time with no discernable phasing of her menstrual cycle Contraception: OCP (estrogen/progesterone) Last Pap: 11/15/2016. Results were: normal with negative HPV Last breast imaging: 07/27/2018. Results were: normal but nipple discharge was noted. Radiologist advised referral to breast surgeon with possible MRI.  Obstetric History OB History  Gravida Para Term Preterm AB Living  2 2 2  0 0 2  SAB TAB Ectopic Multiple Live Births  0 0 0 0 2    # Outcome Date GA Lbr Len/2nd Weight Sex Delivery Anes PTL Lv  2 Term 03/15/15 [redacted]w[redacted]d 04:18 / 00:40 3.185 kg F Vag-Spont EPI  LIV  1 Term 02/02/10 [redacted]w[redacted]d  2.863 kg M Vag-Spont  EPI N LIV    Past Medical History:  Diagnosis Date  . Shingles 2013 and 2014    Past Surgical History:  Procedure Laterality Date  . LEEP    . MOUTH SURGERY  07/2014    Current Outpatient Medications on File Prior to Visit  Medication Sig Dispense Refill  . norgestimate-ethinyl estradiol (ORTHO-CYCLEN,SPRINTEC,PREVIFEM) 0.25-35 MG-MCG tablet Take 1 tablet by mouth daily. 1 Package 3  . Pediatric Multivit-Minerals-C (FLINTSTONES GUMMIES PO) Take 2 each by mouth at bedtime.    . busPIRone (BUSPAR) 10 MG tablet Take 1 tablet (10 mg total) by mouth 2 (two) times daily. (Patient not taking: Reported on 07/18/2018) 60 tablet 3  . fluconazole (DIFLUCAN) 150 MG tablet Take 1 tablet (150 mg total) by mouth every 3 (three) days. For three doses (Patient not taking: Reported on 03/03/2017) 3 tablet 3  . medroxyPROGESTERone (PROVERA) 10 MG tablet Take 3 tablets (30 mg total) by mouth daily. (Patient not taking: Reported on 03/03/2017) 90 tablet 3  . sulfamethoxazole-trimethoprim (BACTRIM DS,SEPTRA DS) 800-160 MG tablet Take 1 tablet by mouth 2 (two) times daily. For 3 days (Patient not taking: Reported on 03/03/2017) 6 tablet 0   No current facility-administered medications on file prior to visit.     No Known Allergies  Social History:  reports that she has never smoked. She has never used smokeless tobacco. She reports that she does not drink alcohol or use drugs.  Family History  Problem Relation Age of Onset  . Mental illness Mother   . Thyroid disease Mother   . Graves' disease Mother   . Diabetes Paternal  Grandmother   . Autism Brother   . Acromegaly Cousin   . Breast cancer Neg Hx     The following portions of the patient's history were reviewed and updated as appropriate: allergies, current medications, past family history, past medical history, past social history, past surgical history and problem list.  Review of Systems Pertinent items noted in HPI and remainder of comprehensive  ROS otherwise negative.  Physical Exam:  BP 112/78   Pulse 80  CONSTITUTIONAL: Well-developed, well-nourished female in no acute distress.  HENT:  Normocephalic, atraumatic, External right and left ear normal. Oropharynx is clear and moist EYES: Conjunctivae and EOM are normal. Pupils are equal, round, and reactive to light. No scleral icterus.  NECK: Normal range of motion, supple, no masses.  Normal thyroid.  SKIN: Skin is warm and dry. No rash noted. Not diaphoretic. No erythema. No pallor. MUSCULOSKELETAL: Normal range of motion. No tenderness.  No cyanosis, clubbing, or edema.  2+ distal pulses. NEUROLOGIC: Alert and oriented to person, place, and time. Normal reflexes, muscle tone coordination. No cranial nerve deficit noted. PSYCHIATRIC: Normal mood and affect. Normal behavior. Normal judgment and thought content. CARDIOVASCULAR: Normal heart rate noted, regular rhythm RESPIRATORY: Clear to auscultation bilaterally. Effort and breath sounds normal, no problems with respiration noted. BREASTS: Symmetric in size. No masses, skin changes, or lymphadenopathy. No nipple discharge noted during today's visit. ABDOMEN: Soft, normal bowel sounds, no distention noted.  No tenderness, rebound or guarding.  PELVIC: Normal appearing external genitalia; normal appearing vaginal mucosa and cervix.  No abnormal discharge noted.  Pap smear obtained.  Normal uterine size, no other palpable masses, no uterine or adnexal tenderness.   Assessment and Plan:    1. 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 Re-ordered today - TSH - Prolactin   2. Encounter for well woman exam with routine gynecological exam - Cytology - PAP - STI screening declined by patient - OCP refilled,  POCT urine pregnancy negative  Will follow up results of pap smear and manage accordingly. Will coordinate lab results and follow-on care with Dr. Vergie LivingPickens as previously planned Routine preventative  health maintenance measures emphasized. Please refer to After Visit Summary for other counseling recommendations.     Total visit time 45 minutes. Greater than 50% of visit spent in counseling and coordination of care.  Clayton BiblesSamantha Weinhold, CNM [facility]

## 2018-08-03 ENCOUNTER — Telehealth: Payer: Self-pay | Admitting: Obstetrics and Gynecology

## 2018-08-03 ENCOUNTER — Telehealth: Payer: Self-pay | Admitting: Advanced Practice Midwife

## 2018-08-03 DIAGNOSIS — N643 Galactorrhea not associated with childbirth: Secondary | ICD-10-CM

## 2018-08-03 LAB — HEMOGLOBIN A1C
Est. average glucose Bld gHb Est-mCnc: 97 mg/dL
Hgb A1c MFr Bld: 5 % (ref 4.8–5.6)

## 2018-08-03 LAB — PROLACTIN: Prolactin: 8.5 ng/mL (ref 4.8–23.3)

## 2018-08-03 LAB — TSH: TSH: 1.16 u[IU]/mL (ref 0.450–4.500)

## 2018-08-03 NOTE — Telephone Encounter (Signed)
GYN Telephone Note Patient called at [phone] d/w her normal TSH and PRL levels and nothing obviously wrong on the mammogram or u/s but given her s/s recommend eval by breast surgery which she is amenable to.  Durene Romans MD Attending Center for [facility] (Faculty Practice) 08/03/2018 Time: 1111am

## 2018-08-03 NOTE — Telephone Encounter (Signed)
Patient called by me and notified of normal results overnight.  Discussed yesterday's clinic and lab and imaging results with Dr. Ilda Basset. He will coordinate next steps and plans to call patient later today.  Mallie Snooks, CNM 08/03/18 9:50 AM

## 2018-08-08 LAB — CYTOLOGY - PAP
Diagnosis: NEGATIVE
HPV: NOT DETECTED

## 2018-08-10 ENCOUNTER — Other Ambulatory Visit: Payer: Self-pay

## 2018-08-10 ENCOUNTER — Encounter: Payer: Self-pay | Admitting: General Surgery

## 2018-08-10 ENCOUNTER — Ambulatory Visit (INDEPENDENT_AMBULATORY_CARE_PROVIDER_SITE_OTHER): Payer: Medicaid Other | Admitting: General Surgery

## 2018-08-10 VITALS — BP 117/84 | Temp 97.9°F | Ht 60.0 in | Wt 126.0 lb

## 2018-08-10 DIAGNOSIS — N6452 Nipple discharge: Secondary | ICD-10-CM

## 2018-08-10 NOTE — Progress Notes (Signed)
Patient ID: Maria Pugh, female   DOB: 12/06/1985, 33 y.o.   MRN: [id number]  Chief Complaint  Patient presents with  . New Patient (Initial Visit)    new pt ref Dr.Charlie Pickens Galactorrhea    HPI Maria Pugh is a 33 y.o. female.  Here for evaluation of right nipple discharge referred by Dr Killdeer Bing. She states she noticed this about 3-4 weeks ago, black in color to start, now more clear.  She states during her mammogram on 07-27-18 she had clear drainage.   Occasional bilateral breast tenderness. She states it starts from the breast bone and moves laterally.  She stopped breast feeding in May 2017, however the left breast still produces some milk. Denies any breast injury or trauma.  The patient reports that since her second child 3 years ago, her periods have been very irregular in spite of making use of oral contraceptives.  She may have one period a month or have 3 periods a month with heavy flow.  She does not appreciate any significant change in breast tenderness with her menstrual cycle.  The patient reports that she had significant enlargement of her breast from "A" cup to "D" cup during her second pregnancy with regression on the right and less progression on the left.  She has been asymmetric since that time.  She was unable to nurse her first child due to a little milk volume.     HPI  Past Medical History:  Diagnosis Date  . Shingles 2013 and 2014    Past Surgical History:  Procedure Laterality Date  . LEEP    . MOUTH SURGERY  07/2014    Family History  Problem Relation Age of Onset  . Mental illness Mother   . Thyroid disease Mother   . Graves' disease Mother   . Diabetes Paternal Grandmother   . Autism Brother   . Acromegaly Cousin   . Breast cancer Neg Hx     Social History Social History   Tobacco Use  . Smoking status: Never Smoker  . Smokeless tobacco: Never Used  Substance Use Topics  . Alcohol use: No  . Drug use: No     No Known Allergies  Current Outpatient Medications  Medication Sig Dispense Refill  . norgestimate-ethinyl estradiol (ORTHO-CYCLEN) 0.25-35 MG-MCG tablet Take 1 tablet by mouth daily. 1 Package 11  . Pediatric Multivit-Minerals-C (FLINTSTONES GUMMIES PO) Take 2 each by mouth at bedtime.     No current facility-administered medications for this visit.     Review of Systems Review of Systems  Constitutional: Negative.   Respiratory: Negative.   Cardiovascular: Negative.     Blood pressure 117/84, temperature 97.9 F (36.6 C), temperature source Temporal, height 5' (1.524 m), weight 126 lb (57.2 kg), last menstrual period 08/07/2018, SpO2 98 %.  Physical Exam Physical Exam Exam conducted with a chaperone present.  Constitutional:      Appearance: She is well-developed.  Eyes:     General: No scleral icterus.    Conjunctiva/sclera: Conjunctivae normal.  Neck:     Musculoskeletal: Neck supple.  Cardiovascular:     Rate and Rhythm: Normal rate and regular rhythm.     Heart sounds: Normal heart sounds.  Pulmonary:     Effort: Pulmonary effort is normal.     Breath sounds: Normal breath sounds.  Chest:     Breasts:        Right: Nipple discharge present. No inverted nipple, mass, skin change or tenderness.  Left: No inverted nipple, mass, nipple discharge, skin change or tenderness.       Comments: Right breast < left breast. Clear right discharge  Lymphadenopathy:     Cervical: No cervical adenopathy.     Upper Body:     Right upper body: No supraclavicular or axillary adenopathy.     Left upper body: No supraclavicular or axillary adenopathy.  Skin:    General: Skin is warm and dry.  Neurological:     Mental Status: She is alert and oriented to person, place, and time.  Psychiatric:        Behavior: Behavior normal.     Data Reviewed Bilateral breast mammogram and bilateral ultrasound examinations of July 27, 2018 were independently reviewed.  Both  negative.  BI-RADS-1.  Surgical consultation and bilateral MRI recommended.  Laboratory studies of August 02, 2018 showed normal serum prolactin, TSH and hemoglobin A1c.  Assessment Galactorrhea on the left breast, intermittent, asymptomatic.  Transient nipple discharge from the right without clear history of trauma or radiologic source.  Resolving.  Plan While an MRI might be considered, the patient has no family history of breast cancer, no risk factors for breast cancer and the likelihood of a missed pathologic process seems to be quite low.  As the drainage has gone from dark to clear and is lessening in volume, it may be related to her irregular menses as much is anything else and observation is warranted at this time.  Should she appreciate change in volume, color or breast discomfort, reassessment would be undertaken at any time, otherwise we will plan for a follow-up in 2 months.  In the interval, she has been asked to make use of antioxidant vitamins such as Ocuvite or Protegra twice a day in case there is an inflammatory component to the breast discharge.  The patient is aware to call back for any questions or new concerns. Follow up in 2 months sooner if needed.   HPI, assessment, plan and physical exam has been scribed under the direction and in the presence of Earline Mayotte, MD. Dorathy Daft, RN  I have completed the exam and reviewed the above documentation for accuracy and completeness.  I agree with the above.  Museum/gallery conservator has been used and any errors in dictation or transcription are unintentional.  Donnalee Curry, M.D., F.A.C.S.  Maria Pugh 08/11/2018, 5:09 PM

## 2018-08-10 NOTE — Patient Instructions (Addendum)
The patient is aware to call back for any questions or new concerns. Recommend taking an occuvit twice a day

## 2018-09-12 ENCOUNTER — Ambulatory Visit: Payer: Self-pay | Admitting: General Surgery

## 2018-09-15 ENCOUNTER — Ambulatory Visit: Payer: Self-pay | Admitting: Surgery

## 2018-09-26 ENCOUNTER — Ambulatory Visit: Payer: Self-pay | Admitting: Surgery

## 2018-10-03 ENCOUNTER — Ambulatory Visit: Payer: Self-pay | Admitting: Surgery

## 2018-12-05 ENCOUNTER — Encounter: Payer: Self-pay | Admitting: *Deleted

## 2019-02-27 ENCOUNTER — Telehealth: Payer: Self-pay

## 2019-02-27 NOTE — Telephone Encounter (Signed)
Copied from CRM [id number]. Topic: General - Other >> Feb 27, 2019 11:32 AM Herby Abraham C wrote: Reason for CRM: pt called in to schedule an apt, she is a medicaid pt, she would like to know if any providers are accepting insurance?   CB: [phone]

## 2019-02-28 NOTE — Telephone Encounter (Signed)
Called and spoke with patient to arrange appointment but she could not work it in her schedule to come in until the late evening, which our office does not have available at this time for new patients. Patient reports that she will contact another office to see if they have availability after 4PM. KW

## 2019-03-26 ENCOUNTER — Other Ambulatory Visit: Payer: Self-pay

## 2019-03-26 ENCOUNTER — Encounter: Payer: Self-pay | Admitting: Family Medicine

## 2019-03-26 ENCOUNTER — Ambulatory Visit (INDEPENDENT_AMBULATORY_CARE_PROVIDER_SITE_OTHER): Payer: Medicaid Other | Admitting: Family Medicine

## 2019-03-26 VITALS — HR 89 | Ht 60.0 in | Wt 125.0 lb

## 2019-03-26 DIAGNOSIS — L659 Nonscarring hair loss, unspecified: Secondary | ICD-10-CM | POA: Diagnosis not present

## 2019-03-26 DIAGNOSIS — N643 Galactorrhea not associated with childbirth: Secondary | ICD-10-CM | POA: Diagnosis not present

## 2019-03-26 DIAGNOSIS — Z7689 Persons encountering health services in other specified circumstances: Secondary | ICD-10-CM | POA: Diagnosis not present

## 2019-03-26 NOTE — Progress Notes (Signed)
Name: RUFUS CYPERT   MRN: [id number]    DOB: [birth year]   Date:03/26/2019       Progress Note  Chief Complaint  Patient presents with  . New Patient (Initial Visit)  . Breast Problem    bilateral, states still leaks milk periodically even though daughter is 64yrs old.  Has seen surgeon and had u/s  . Alopecia    hair dryness with sores, skin dryness, skin itching with brusing and pin prik feeling on top of feet     Subjective:   Maria Pugh is a 34 y.o. female, presents to clinic to establish care, I did call her late from her appointment time and she did have to go to work so were only able to speak for a few minutes briefly.  She presents for spontaneous nipple discharge last imaging was about 8 months ago.  Imaging copied below with recommendation for surgical consult and bilateral breast MRI.  Patient did follow-up with general surgery, Dr. Doristine Counter June of last year  [facility] -   IMPRESSION: Spontaneous RIGHT nipple discharge warranting further evaluation. Mammogram and ultrasound are negative.  RECOMMENDATION: Recommend surgical consultation and bilateral breast MRI with and without contrast.  I have discussed the findings and recommendations with the patient. Results were also provided in writing at the conclusion of the visit. If applicable, a reminder letter will be sent to the patient regarding the next appointment.   Patient also complains of hair and skin changes feels her hair is falling out her skin is very dry she has sores and sometimes has tenderness and pain to her skin especially to the top of her scalp.  She is not had any significant weight change since last year.  Depression screen [facility] 2/9 03/26/2019 01/31/2017 01/28/2017  Decreased Interest 0 1 1  Down, Depressed, Hopeless 0 3 2  PHQ - 2 Score 0 4 3  Altered sleeping 1 2 3   Tired, decreased energy 1 0 1  Change in appetite 0 3 3  Feeling bad or failure about yourself  0 3 1   Trouble concentrating 1 2 2   Moving slowly or fidgety/restless 0 1 2  Suicidal thoughts 0 0 0  PHQ-9 Score 3 15 15   Difficult doing work/chores Somewhat difficult Somewhat difficult Very difficult    Wt Readings from Last 5 Encounters:  03/26/19 125 lb (56.7 kg)  08/10/18 126 lb (57.2 kg)  07/18/18 125 lb (56.7 kg)  03/03/17 117 lb (53.1 kg)  01/28/17 109 lb 8 oz (49.7 kg)   BMI Readings from Last 5 Encounters:  03/26/19 24.41 kg/m  08/10/18 24.61 kg/m  07/18/18 24.41 kg/m  03/03/17 22.11 kg/m  01/28/17 20.69 kg/m     Patient Active Problem List   Diagnosis Date Noted  . Anxiety and depression 01/31/2017  . Galactorrhea 11/15/2016  . Abnormal uterine bleeding (AUB) 11/15/2016  . Breakthrough bleeding on birth control pills 11/18/2015  . History of abnormal cervical Pap smear 08/09/2014    Past Surgical History:  Procedure Laterality Date  . LEEP    . MOUTH SURGERY  07/2014    Family History  Problem Relation Age of Onset  . Mental illness Mother   . Thyroid disease Mother   . Graves' disease Mother   . Bipolar disorder Mother   . Diabetes Paternal Grandmother   . Pancreatic cancer Paternal Grandmother   . Autism Brother   . Hashimoto's thyroiditis Brother   . Acromegaly Cousin   .  Hypertension Father   . Stroke Father   . Clotting disorder Father   . Heart attack Father   . Developmental delay Daughter   . Lung cancer Maternal Grandfather   . Lung disease Paternal Grandfather   . Kidney disease Paternal Grandfather   . Breast cancer Neg Hx     Social History   Socioeconomic History  . Marital status: Divorced    Spouse name: Not on file  . Number of children: 2  . Years of education: 77  . Highest education level: Bachelor's degree (e.g., BA, AB, BS)  Occupational History  . Occupation: Curator  Tobacco Use  . Smoking status: Never Smoker  . Smokeless tobacco: Never Used  Substance and Sexual Activity  . Alcohol use: No   . Drug use: No  . Sexual activity: Yes    Birth control/protection: Pill  Other Topics Concern  . Not on file  Social History Narrative  . Not on file   Social Determinants of Health   Financial Resource Strain:   . Difficulty of Paying Living Expenses: Not on file  Food Insecurity:   . Worried About Programme researcher, broadcasting/film/video in the Last Year: Not on file  . Ran Out of Food in the Last Year: Not on file  Transportation Needs:   . Lack of Transportation (Medical): Not on file  . Lack of Transportation (Non-Medical): Not on file  Physical Activity:   . Days of Exercise per Week: Not on file  . Minutes of Exercise per Session: Not on file  Stress:   . Feeling of Stress : Not on file  Social Connections:   . Frequency of Communication with Friends and Family: Not on file  . Frequency of Social Gatherings with Friends and Family: Not on file  . Attends Religious Services: Not on file  . Active Member of Clubs or Organizations: Not on file  . Attends Banker Meetings: Not on file  . Marital Status: Not on file  Intimate Partner Violence:   . Fear of Current or Ex-Partner: Not on file  . Emotionally Abused: Not on file  . Physically Abused: Not on file  . Sexually Abused: Not on file     Current Outpatient Medications:  .  norgestimate-ethinyl estradiol (ORTHO-CYCLEN) 0.25-35 MG-MCG tablet, Take 1 tablet by mouth daily., Disp: , Rfl:  .  Pediatric Multivit-Minerals-C (FLINTSTONES GUMMIES PO), Take 2 each by mouth at bedtime., Disp: , Rfl:   No Known Allergies  Chart Review Today: I personally reviewed active problem list, medication list, allergies, family history, social history, health maintenance, notes from last encounter, lab results, imaging with the patient/caregiver today.   Review of Systems  Constitutional: Negative.   HENT: Negative.   Eyes: Negative.   Respiratory: Negative.   Cardiovascular: Negative.   Gastrointestinal: Negative.   Endocrine:  Negative.   Genitourinary: Negative.   Musculoskeletal: Negative.   Skin: Negative.   Allergic/Immunologic: Negative.   Neurological: Negative.   Hematological: Negative.   Psychiatric/Behavioral: Negative.   All other systems reviewed and are negative.    Objective:    Vitals:   03/26/19 1438  Pulse: 89  SpO2: 98%  Weight: 125 lb (56.7 kg)  Height: 5' (1.524 m)    Body mass index is 24.41 kg/m.  Physical Exam  Video did not connect, unable to do any inspection for encounter Phonation clear, pt alert  PHQ2/9: Depression screen [facility] 2/9 03/26/2019 01/31/2017 01/28/2017 09/12/2014  Decreased Interest 0 1  1 0  Down, Depressed, Hopeless 0 3 2 0  PHQ - 2 Score 0 4 3 0  Altered sleeping 1 2 3  -  Tired, decreased energy 1 0 1 -  Change in appetite 0 3 3 -  Feeling bad or failure about yourself  0 3 1 -  Trouble concentrating 1 2 2  -  Moving slowly or fidgety/restless 0 1 2 -  Suicidal thoughts 0 0 0 -  PHQ-9 Score 3 15 15  -  Difficult doing work/chores Somewhat difficult Somewhat difficult Very difficult -    phq 9 is neg, less than three, reviewed today  Fall Risk: Fall Risk  03/26/2019 08/10/2018 09/12/2014  Falls in the past year? 0 0 No  Number falls in past yr: 0 - -  Injury with Fall? 0 - -  Follow up - Falls evaluation completed -    Functional Status Survey: Is the patient deaf or have difficulty hearing?: No Does the patient have difficulty seeing, even when wearing glasses/contacts?: No Does the patient have difficulty concentrating, remembering, or making decisions?: No Does the patient have difficulty walking or climbing stairs?: No Does the patient have difficulty dressing or bathing?: No Does the patient have difficulty doing errands alone such as visiting a doctor's office or shopping?: No   Assessment & Plan:   Galactorrhea - Plan: Ambulatory referral to General Surgery  Hair loss - Plan: CBC with Differential/Platelet, COMPLETE METABOLIC PANEL WITH  GFR, TSH  Encounter to establish care with new doctor  Video encounter did not connect correctly was unable to do complete visit today patient will need to come into clinic for physical exam or arrange a time to do virtual encounter  For now she was referred back to general surgery for follow-up.  Basic labs were put into check for anemia electrolyte imbalance and thyroid dysfunction     Delsa Grana, PA-C 03/26/19 4:07 PM

## 2019-03-29 ENCOUNTER — Telehealth: Payer: Self-pay

## 2019-03-29 NOTE — Telephone Encounter (Signed)
Did you ever do a visit with this patient.  I know she saw Dr. Signe Colt but did not care for him, so Im sure that's why she refused appt.

## 2019-03-29 NOTE — Telephone Encounter (Signed)
Copied from CRM [id number]. Topic: General - Other >> Mar 29, 2019  2:05 PM Angela Nevin wrote: Marcelino Duster with Dr. Zigmund Gottron office calling to report patient declined appointment when they reached out to her. FYI.

## 2019-04-02 NOTE — Telephone Encounter (Signed)
Did you talk to her?  Do I need to call her?  She did not like brynette?

## 2019-04-11 ENCOUNTER — Other Ambulatory Visit: Payer: Self-pay

## 2019-04-11 ENCOUNTER — Encounter: Payer: Self-pay | Admitting: Family Medicine

## 2019-04-11 ENCOUNTER — Ambulatory Visit: Payer: Medicaid Other | Admitting: Family Medicine

## 2019-04-11 VITALS — BP 118/78 | HR 99 | Temp 97.8°F | Resp 14 | Ht 60.0 in | Wt 117.1 lb

## 2019-04-11 DIAGNOSIS — N6452 Nipple discharge: Secondary | ICD-10-CM | POA: Diagnosis not present

## 2019-04-11 DIAGNOSIS — L989 Disorder of the skin and subcutaneous tissue, unspecified: Secondary | ICD-10-CM | POA: Diagnosis not present

## 2019-04-11 DIAGNOSIS — Z5181 Encounter for therapeutic drug level monitoring: Secondary | ICD-10-CM

## 2019-04-11 DIAGNOSIS — N643 Galactorrhea not associated with childbirth: Secondary | ICD-10-CM

## 2019-04-11 DIAGNOSIS — L853 Xerosis cutis: Secondary | ICD-10-CM | POA: Diagnosis not present

## 2019-04-11 DIAGNOSIS — Z8349 Family history of other endocrine, nutritional and metabolic diseases: Secondary | ICD-10-CM | POA: Diagnosis not present

## 2019-04-11 DIAGNOSIS — L659 Nonscarring hair loss, unspecified: Secondary | ICD-10-CM

## 2019-04-11 HISTORY — DX: Nipple discharge: N64.52

## 2019-04-11 LAB — COMPLETE METABOLIC PANEL WITH GFR
AG Ratio: 1.7 (calc) (ref 1.0–2.5)
ALT: 12 U/L (ref 6–29)
AST: 15 U/L (ref 10–30)
Albumin: 4.7 g/dL (ref 3.6–5.1)
Alkaline phosphatase (APISO): 54 U/L (ref 31–125)
BUN: 9 mg/dL (ref 7–25)
CO2: 26 mmol/L (ref 20–32)
Calcium: 9.6 mg/dL (ref 8.6–10.2)
Chloride: 104 mmol/L (ref 98–110)
Creat: 0.83 mg/dL (ref 0.50–1.10)
GFR, Est African American: 109 mL/min/{1.73_m2} (ref >=60)
GFR, Est Non African American: 94 mL/min/{1.73_m2} (ref >=60)
Globulin: 2.8 g/dL (calc) (ref 1.9–3.7)
Glucose, Bld: 83 mg/dL (ref 65–99)
Potassium: 3.9 mmol/L (ref 3.5–5.3)
Sodium: 139 mmol/L (ref 135–146)
Total Bilirubin: 0.5 mg/dL (ref 0.2–1.2)
Total Protein: 7.5 g/dL (ref 6.1–8.1)

## 2019-04-11 LAB — CBC WITH DIFFERENTIAL/PLATELET
Absolute Monocytes: 442 cells/uL (ref 200–950)
Basophils Absolute: 43 cells/uL (ref 0–200)
Basophils Relative: 0.5 %
Eosinophils Absolute: 119 cells/uL (ref 15–500)
Eosinophils Relative: 1.4 %
HCT: 44.6 % (ref 35.0–45.0)
Hemoglobin: 15.2 g/dL (ref 11.7–15.5)
Lymphs Abs: 3766 cells/uL (ref 850–3900)
MCH: 32.3 pg (ref 27.0–33.0)
MCHC: 34.1 g/dL (ref 32.0–36.0)
MCV: 94.9 fL (ref 80.0–100.0)
MPV: 9.3 fL (ref 7.5–12.5)
Monocytes Relative: 5.2 %
Neutro Abs: 4131 cells/uL (ref 1500–7800)
Neutrophils Relative %: 48.6 %
Platelets: 333 10*3/uL (ref 140–400)
RBC: 4.7 10*6/uL (ref 3.80–5.10)
RDW: 11.4 % (ref 11.0–15.0)
Total Lymphocyte: 44.3 %
WBC: 8.5 10*3/uL (ref 3.8–10.8)

## 2019-04-11 LAB — PROLACTIN: Prolactin: 3.9 ng/mL

## 2019-04-11 LAB — HCG, QUANTITATIVE, PREGNANCY: HCG, Total, QN: <3 m[IU]/mL

## 2019-04-11 LAB — T4, FREE: Free T4: 1.3 ng/dL (ref 0.8–1.8)

## 2019-04-11 LAB — TSH: TSH: 0.63 mIU/L

## 2019-04-11 MED ORDER — TRIAMCINOLONE ACETONIDE 0.1 % EX OINT: 1.0000 "application " | TOPICAL_OINTMENT | Freq: Two times a day (BID) | CUTANEOUS | 0 refills | Status: DC | PRN

## 2019-04-11 MED ORDER — KETOCONAZOLE 2 % EX SHAM: 1.0000 "application " | MEDICATED_SHAMPOO | CUTANEOUS | 0 refills | Status: DC

## 2019-04-11 NOTE — Progress Notes (Signed)
Name: Maria Pugh   MRN: [id number]    DOB: 03/11/1985   Date:04/11/2019       Progress Note  Chief Complaint  Patient presents with  . hormones    dry/itchy skin, spider veins, hair thinning, breast milk leakage, cyst/ blister on scalp    Subjective:   Maria Pugh is a 34 y.o. female, presents to clinic for routine follow up on the conditions listed above.  Follow up for rash//bumps to scalp with some rashes and hair loss, severe hair loss for 4 years.    Right breast no nipple discharge over the past couple months Left breast enlarged x 4 years, firm, non-tender, intermittent milky discharge last was one week ago in the shower - spontaneous milky discharge  She is concerned about rash over the past couple months - thicker patchy rashes to arms ankles- dry skin, trying to hydrate, different rash and bumps to scalp some red peeling, some firm bumps   Patient Active Problem List   Diagnosis Date Noted  . Anxiety and depression 01/31/2017  . Galactorrhea 11/15/2016  . Abnormal uterine bleeding (AUB) 11/15/2016  . Breakthrough bleeding on birth control pills 11/18/2015  . History of abnormal cervical Pap smear 08/09/2014    Past Surgical History:  Procedure Laterality Date  . LEEP    . MOUTH SURGERY  07/2014    Family History  Problem Relation Age of Onset  . Mental illness Mother   . Thyroid disease Mother   . Graves' disease Mother   . Bipolar disorder Mother   . Diabetes Paternal Grandmother   . Pancreatic cancer Paternal Grandmother   . Autism Brother   . Hashimoto's thyroiditis Brother   . Acromegaly Cousin   . Hypertension Father   . Stroke Father   . Clotting disorder Father   . Heart attack Father   . Developmental delay Daughter   . Lung cancer Maternal Grandfather   . Lung disease Paternal Grandfather   . Kidney disease Paternal Grandfather   . Breast cancer Neg Hx     Social History   Tobacco Use  . Smoking status: Never Smoker  .  Smokeless tobacco: Never Used  Substance Use Topics  . Alcohol use: No  . Drug use: No      Current Outpatient Medications:  .  norgestimate-ethinyl estradiol (ORTHO-CYCLEN) 0.25-35 MG-MCG tablet, Take 1 tablet by mouth daily., Disp: , Rfl:  .  Pediatric Multivit-Minerals-C (FLINTSTONES GUMMIES PO), Take 2 each by mouth at bedtime., Disp: , Rfl:   No Known Allergies  Chart Review Today: I personally reviewed active problem list, medication list, allergies, family history, social history, health maintenance, notes from last encounter, lab results, imaging with the patient/caregiver today.   Review of Systems  Constitutional: Negative.   HENT: Negative.   Eyes: Negative.   Respiratory: Negative.   Cardiovascular: Negative.   Gastrointestinal: Negative.   Endocrine: Negative.  Negative for cold intolerance, heat intolerance, polydipsia, polyphagia and polyuria.  Genitourinary: Negative.   Musculoskeletal: Negative.   Skin: Negative.   Allergic/Immunologic: Negative.   Neurological: Negative.   Hematological: Negative.   Psychiatric/Behavioral: Negative.   All other systems reviewed and are negative.    Objective:    Vitals:   04/11/19 1530  BP: 118/78  Pulse: 99  Resp: 14  Temp: 97.8 F (36.6 C)  SpO2: 98%  Weight: 117 lb 1.6 oz (53.1 kg)  Height: 5' (1.524 m)    Body mass index is 22.87 kg/m.  Physical Exam Vitals and nursing note reviewed.  Constitutional:      General: She is not in acute distress.    Appearance: Normal appearance. She is well-developed. She is not ill-appearing, toxic-appearing or diaphoretic.  HENT:     Head: Normocephalic and atraumatic.     Right Ear: External ear normal.     Left Ear: External ear normal.     Nose: Nose normal.     Mouth/Throat:     Mouth: Mucous membranes are moist.     Pharynx: Oropharynx is clear.  Eyes:     General:        Right eye: No discharge.        Left eye: No discharge.     Conjunctiva/sclera:  Conjunctivae normal.  Neck:     Trachea: No tracheal deviation.  Cardiovascular:     Rate and Rhythm: Normal rate and regular rhythm.  Pulmonary:     Effort: Pulmonary effort is normal. No respiratory distress.     Breath sounds: No stridor.  Chest:     Breasts:        Right: No swelling, bleeding, inverted nipple, mass, nipple discharge, skin change or tenderness.        Left: Nipple discharge present. No swelling, bleeding, inverted nipple, mass, skin change or tenderness.     Comments: Left breast mildly larger than right Abdominal:     General: Bowel sounds are normal. There is no distension.     Palpations: Abdomen is soft.     Tenderness: There is no abdominal tenderness.  Musculoskeletal:        General: Normal range of motion.     Right lower leg: No edema.     Left lower leg: No edema.  Lymphadenopathy:     Upper Body:     Right upper body: No axillary or pectoral adenopathy.     Left upper body: No supraclavicular, axillary or pectoral adenopathy.  Skin:    General: Skin is warm and dry.     Coloration: Skin is not pale.     Findings: Rash present.     Comments: Mildly dry skin in patches to elbows, forearms, ankles, no erythema Some dry raised patched to scalp  Flesh colored raised nodule with alopecia to anterior scalp roughly 1 cm   Neurological:     Mental Status: She is alert.     Motor: No abnormal muscle tone.     Coordination: Coordination normal.  Psychiatric:        Mood and Affect: Mood normal.        Behavior: Behavior normal.      PHQ2/9: Depression screen [facility] 2/9 04/11/2019 03/26/2019 01/31/2017 01/28/2017 09/12/2014  Decreased Interest 0 0 1 1 0  Down, Depressed, Hopeless 0 0 3 2 0  PHQ - 2 Score 0 0 4 3 0  Altered sleeping 2 1 2 3  -  Tired, decreased energy 2 1 0 1 -  Change in appetite 0 0 3 3 -  Feeling bad or failure about yourself  0 0 3 1 -  Trouble concentrating 0 1 2 2  -  Moving slowly or fidgety/restless 0 0 1 2 -  Suicidal thoughts  0 0 0 0 -  PHQ-9 Score 4 3 15 15  -  Difficult doing work/chores - Somewhat difficult Somewhat difficult Very difficult -   phq 9 is neg, score 4   Fall Risk: Fall Risk  04/11/2019 03/26/2019 08/10/2018 09/12/2014  Falls in the past year?  0 0 0 No  Number falls in past yr: 0 0 - -  Injury with Fall? 0 0 - -  Follow up - - Falls evaluation completed -    Functional Status Survey: Is the patient deaf or have difficulty hearing?: No Does the patient have difficulty seeing, even when wearing glasses/contacts?: No Does the patient have difficulty concentrating, remembering, or making decisions?: No Does the patient have difficulty walking or climbing stairs?: No Does the patient have difficulty dressing or bathing?: No Does the patient have difficulty doing errands alone such as visiting a doctor's office or shopping?: No   Assessment & Plan:     ICD-10-CM   1. Hair thinning  L65.9 TSH    T4, free    Prolactin   pt wants thyroid checked  2. Discharge from left nipple  N64.52 Prolactin    hCG, quantitative, pregnancy   ongoing for about 4 years, easy to express on exam, milky - pt wishes for second opinion for sugerical referral  3. Galactorrhea  N64.3 TSH    T4, free    Prolactin    hCG, quantitative, pregnancy   see above, repeat labs today  4. Hair loss  L65.9 TSH    T4, free   complains of over a year of hair thinning  5. Dry skin dermatitis  L85.3 TSH    T4, free    ketoconazole (NIZORAL) 2 % shampoo   hydrate with vaseline and topical steroid sparingly  6. Scalp lesion  L98.9 ketoconazole (NIZORAL) 2 % shampoo   nodule- flesh colored - will refer to derm - may need excision cyst? not consistent with abscess, kerion/tinea Small patches to scalp possibly seborrheic dermatitis trial of ketoconazole shampoo - very mild   7. Family history of thyroid disease in mother  Z67.49 TSH    T4, free   family hx, checking thyroid labs  8. Encounter for medication monitoring  Z51.81 TSH     T4, free    Prolactin    CBC with Differential/Platelet    COMPLETE METABOLIC PANEL WITH GFR    ketoconazole (NIZORAL) 2 % shampoo    Ambulatory referral to Dermatology      No follow-ups on file.   Danelle Berry, PA-C 04/11/19 3:38 PM

## 2019-04-16 ENCOUNTER — Encounter: Payer: Self-pay | Admitting: Family Medicine

## 2019-04-19 ENCOUNTER — Other Ambulatory Visit: Payer: Self-pay

## 2019-04-19 ENCOUNTER — Encounter: Payer: Self-pay | Admitting: Surgery

## 2019-04-19 ENCOUNTER — Ambulatory Visit (INDEPENDENT_AMBULATORY_CARE_PROVIDER_SITE_OTHER): Payer: Medicaid Other | Admitting: Surgery

## 2019-04-19 VITALS — BP 129/97 | HR 105 | Temp 93.6°F | Ht 60.0 in | Wt 122.6 lb

## 2019-04-19 DIAGNOSIS — N6452 Nipple discharge: Secondary | ICD-10-CM

## 2019-04-19 DIAGNOSIS — N644 Mastodynia: Secondary | ICD-10-CM | POA: Diagnosis not present

## 2019-04-19 MED ORDER — DICLOXACILLIN SODIUM 250 MG PO CAPS: 250.0000 mg | ORAL_CAPSULE | Freq: Four times a day (QID) | ORAL | 0 refills | Status: AC

## 2019-04-19 NOTE — Patient Instructions (Addendum)
Dr.Rodenberg advised patient that she may take Ibuprofen 600 mg up to three times a day for a couple of weeks to see if it may help with the pain and discomfort of the breast. He discussed with patient to not be concerned with pink nipple discharge at the moment. Dr.Rodenberg prescribed Dynapen to patient's local pharmacy. You are scheduled for a mammogram and ultrasound of the left breast tomorrow, 04/20/19 at 2:40 pm.  Galactorrhea Galactorrhea is the flow of a milky fluid (discharge) from the breast. It is different from normal milk in nursing mothers. The fluid can be white, yellow, or green. This condition can be caused by many things. Most cases are not serious and do not require treatment. Watch your condition to make sure it goes away. Follow these instructions at home: Breast care   Watch your condition for any changes.  Do not squeeze your breasts or nipples.  Avoid touching your breasts when you are having sex.  Perform a breast self-exam once a month.  Avoid clothes that rub on your nipples.  Use breast pads to absorb the fluid.  Wear a support bra or a breast binder. General instructions  Take over-the-counter and prescription medicines only as told by your doctor.  Keep all follow-up visits as told by your doctor. This is important. Contact a doctor if you:  Have hot flashes.  Have vaginal dryness.  Have no desire for sex.  Stop having periods, or have periods that are irregular or far apart.  Have headaches.  Cannot see well. Get help right away if:  Your breast discharge is bloody or yellowish white (puslike).  You have breast pain.  You feel a lump in your breast.  Your breast shows wrinkling or dimpling.  Your breast becomes red and swollen. Summary  Galactorrhea is the flow of a milky fluid (discharge) from the breast.  This condition may be caused by many things, but it is not usually serious.  Watch your condition carefully to make sure that  it goes away.  Get help right away if the fluid is bloody or yellowish white, or if you have a lump, pain, or skin changes on your breast. This information is not intended to replace advice given to you by your health care provider. Make sure you discuss any questions you have with your health care provider. Document Revised: 02/14/2017 Document Reviewed: 02/14/2017 Elsevier Patient Education  2020 [employer].

## 2019-04-19 NOTE — Progress Notes (Signed)
Patient ID: Maria Pugh, female   DOB: Jul 09, 1985, 34 y.o.   MRN: [id number]  Chief Complaint: Left breast  History of Present Illness Maria Pugh is a 34 y.o. female with a history of persistent milky nipple discharge from the left breast now 4 years following her last pregnancy. Previously seen for a nipple discharge in the right which resolved. 5 days ago, she noted a slight increase in a reddish hue of her left nipple nipple areolar complex, the next two days she had an associated with a pinkish spontaneous discharge. 2 days ago she was more tender, with a knot in the left axilla. She reports her pain being a 3 out of 4 today. She does admit she is feeling better compared to when this all began. She denies any history of fevers and chills. She has been currently followed by her PCP which has done a fairly complete work-up evaluating for other sources/causes of galactorrhea. Recent labs noted and they have all been within normal limits. She had mammography and breast imaging by ultrasound last June which were all negative an MRI was considered but deferred. No prior breast surgery, denies fevers and chills.  Past Medical History Past Medical History:  Diagnosis Date  . Anxiety   . Depression   . Galactorrhea   . Shingles 2013 and 2014      Past Surgical History:  Procedure Laterality Date  . LEEP    . MOUTH SURGERY  07/2014    No Known Allergies  Current Outpatient Medications  Medication Sig Dispense Refill  . norgestimate-ethinyl estradiol (ORTHO-CYCLEN) 0.25-35 MG-MCG tablet Take 1 tablet by mouth daily.    . Pediatric Multivit-Minerals-C (FLINTSTONES GUMMIES PO) Take 2 each by mouth at bedtime.    . triamcinolone ointment (KENALOG) 0.1 % Apply 1 application topically 2 (two) times daily as needed. To dry patchy or thickened skin (not to face) 30 g 0  . dicloxacillin (DYNAPEN) 250 MG capsule Take 1 capsule (250 mg total) by mouth 4 (four) times daily for 10 days. 40  capsule 0  . ketoconazole (NIZORAL) 2 % shampoo Apply 1 application topically 2 (two) times a week. (Patient not taking: Reported on 04/19/2019) 120 mL 0   No current facility-administered medications for this visit.    Family History Family History  Problem Relation Age of Onset  . Mental illness Mother   . Thyroid disease Mother   . Graves' disease Mother   . Bipolar disorder Mother   . Diabetes Paternal Grandmother   . Pancreatic cancer Paternal Grandmother   . Autism Brother   . Hashimoto's thyroiditis Brother   . Acromegaly Cousin   . Hypertension Father   . Stroke Father   . Clotting disorder Father   . Heart attack Father   . Developmental delay Daughter   . Lung cancer Maternal Grandfather   . Lung disease Paternal Grandfather   . Kidney disease Paternal Grandfather   . Breast cancer Neg Hx       Social History Social History   Tobacco Use  . Smoking status: Never Smoker  . Smokeless tobacco: Never Used  Substance Use Topics  . Alcohol use: No  . Drug use: No        Review of Systems  Constitutional: Negative for chills and fever.  HENT: Negative.   Eyes: Negative.   Respiratory: Negative.   Cardiovascular: Negative.   Gastrointestinal: Negative.   Genitourinary: Negative.   Musculoskeletal: Negative.   Skin: Negative.  Neurological: Negative.   Endo/Heme/Allergies: Negative.       Physical Exam Blood pressure (!) 129/97, pulse (!) 105, temperature (!) 93.6 F (34.2 C), temperature source Temporal, height 5' (1.524 m), weight 122 lb 9.6 oz (55.6 kg), last menstrual period 04/09/2019, SpO2 97 %. Last Weight  Most recent update: 04/19/2019  3:00 PM   Weight  55.6 kg (122 lb 9.6 oz)            CONSTITUTIONAL: Well developed, and nourished, appropriately responsive and aware without distress.   EYES: Sclera non-icteric.   EARS, NOSE, MOUTH AND THROAT: Mask worn.   Hearing is intact to voice.  NECK: Trachea is midline, and there is no jugular  venous distension.  LYMPH NODES:  Lymph nodes in the neck are not enlarged. RESPIRATORY:  Lungs are clear, and breath sounds are equal bilaterally. Normal respiratory effort without pathologic use of accessory muscles. CARDIOVASCULAR: Heart is regular in rate and rhythm. GI: The abdomen is soft, nontender, and nondistended.  GU: Left breast is slightly larger than the right, the posterior about the same ptosis.  Unremarkable difference in fibroglandular density from right to left but notably more tender on the left.  Subtle changes in nipple areolar complex, without any remarkable nipple abnormality both nipples are everted and without any appreciable discharge on palpation.  There is no changes in the skin, no streaking.  No appreciable axillary tenderness. MUSCULOSKELETAL:  Symmetrical muscle tone appreciated in all four extremities.    SKIN: Skin turgor is normal. No pathologic skin lesions appreciated.  NEUROLOGIC:  Motor and sensation appear grossly normal.  Cranial nerves are grossly without defect. PSYCH:  Alert and oriented to person, place and time. Affect is appropriate for situation.  Data Reviewed I have personally reviewed what is currently available of the patient's imaging, recent labs and medical records.   Labs:  CBC Latest Ref Rng & Units 04/11/2019 03/15/2015 12/27/2014  WBC 3.8 - 10.8 Thousand/uL 8.5 13.5(H) 12.8(H)  Hemoglobin 11.7 - 15.5 g/dL 15.2 12.3 11.9(L)  Hematocrit 35.0 - 45.0 % 44.6 36.4 36.0  Platelets 140 - 400 Thousand/uL 333 263 280   CMP Latest Ref Rng & Units 04/11/2019 04/11/2012  Glucose 65 - 99 mg/dL 83 86  BUN 7 - 25 mg/dL 9 11  Creatinine 0.50 - 1.10 mg/dL 0.83 0.72  Sodium 135 - 146 mmol/L 139 139  Potassium 3.5 - 5.3 mmol/L 3.9 3.8  Chloride 98 - 110 mmol/L 104 104  CO2 20 - 32 mmol/L 26 27  Calcium 8.6 - 10.2 mg/dL 9.6 8.4  Total Protein 6.1 - 8.1 g/dL 7.5 6.5  Total Bilirubin 0.2 - 1.2 mg/dL 0.5 0.3  Alkaline Phos 39 - 117 U/L - 47  AST 10 - 30  U/L 15 16  ALT 6 - 29 U/L 12 10      Imaging:  Within last 24 hrs: No results found.  Assessment    Left breast tenderness with associated nipple discharge.  Chronic postpartum galactorrhea unilateral. Patient Active Problem List   Diagnosis Date Noted  . Hair thinning 04/11/2019  . Discharge from left nipple 04/11/2019  . Anxiety and depression 01/31/2017  . Galactorrhea 11/15/2016  . Abnormal uterine bleeding (AUB) 11/15/2016  . Breakthrough bleeding on birth control pills 11/18/2015  . History of abnormal cervical Pap smear 08/09/2014    Plan    We discussed a trial of dicloxacillin, NSAIDs with ibuprofen and repeat ultrasound of the left breast.  Follow-up in 2 weeks.  Face-to-face time spent with the patient and accompanying care providers(if present) was 30 minutes, with more than 50% of the time spent counseling, educating, and coordinating care of the patient.      Campbell Lerner M.D., FACS 04/19/2019, 3:56 PM

## 2019-04-20 ENCOUNTER — Ambulatory Visit
Admission: RE | Admit: 2019-04-20 | Discharge: 2019-04-20 | Disposition: A | Discharge status: Home / Self Care | Payer: Medicaid Other | Source: Ambulatory Visit | Attending: Surgery | Admitting: Surgery

## 2019-04-20 DIAGNOSIS — N644 Mastodynia: Secondary | ICD-10-CM | POA: Insufficient documentation

## 2019-04-20 DIAGNOSIS — N6452 Nipple discharge: Secondary | ICD-10-CM | POA: Diagnosis not present

## 2019-04-20 DIAGNOSIS — R928 Other abnormal and inconclusive findings on diagnostic imaging of breast: Secondary | ICD-10-CM | POA: Diagnosis not present

## 2019-05-09 ENCOUNTER — Encounter: Payer: Self-pay | Admitting: Family Medicine

## 2019-05-09 ENCOUNTER — Telehealth (INDEPENDENT_AMBULATORY_CARE_PROVIDER_SITE_OTHER): Payer: Medicaid Other | Admitting: Family Medicine

## 2019-05-09 ENCOUNTER — Other Ambulatory Visit: Payer: Self-pay

## 2019-05-09 DIAGNOSIS — N6452 Nipple discharge: Secondary | ICD-10-CM

## 2019-05-09 NOTE — Progress Notes (Signed)
I connected with  Stormy Fabian on 05/09/19 at  8:15 AM EDT by telephone and verified that I am speaking with the correct person using two identifiers.   I discussed the limitations, risks, security and privacy concerns of performing an evaluation and management service by telephone and the availability of in person appointments. I also discussed with the patient that there may be a patient responsible charge related to this service. The patient expressed understanding and agreed to proceed.  Nolene [place]ocks Emeline Darling, CMA 05/09/2019  8:16 AM  Discuss hormones

## 2019-05-09 NOTE — Progress Notes (Signed)
TELEHEALTH VIRTUAL GYNECOLOGY VISIT ENCOUNTER NOTE  I connected with Maria Pugh on 05/09/19 at  8:15 AM EDT by telephone at home and verified that I am speaking with the correct person using two identifiers.   I discussed the limitations, risks, security and privacy concerns of performing an evaluation and management service by telephone and the availability of in person appointments. I also discussed with the patient that there may be a patient responsible charge related to this service. The patient expressed understanding and agreed to proceed.   History:  Maria Pugh is a 34 y.o. [id number] female being evaluated today for galactorrhea- present for > 1 year and has been evaluated by:  1) multiple TSH/Prolactin levels 2) 2 mammograms 3) PCP and Breast surgeon evaluation   Reports sx started after 2017 when she stopped nursing her now 64 yo daughter. She had persistent milk production after weaning. She reports 1 yr ago she developed bloody/black discharge. She has been told by her PCP to seek OB/GYN care to evaluate her sx and her "hormones"  Currently she had dry skin, thinning hair, nipple discharge, easy bruising. Also has left breast tenderness. She has had AUB in the past and was evaluated for this in 2018  She denies any abnormal vaginal discharge, bleeding, pelvic pain or other concerns.    Last pap was NIL, HPV negative   Past Medical History:  Diagnosis Date  . Anxiety   . Depression   . Galactorrhea   . Shingles 2013 and 2014   Past Surgical History:  Procedure Laterality Date  . LEEP    . MOUTH SURGERY  07/2014   The following portions of the patient's history were reviewed and updated as appropriate: allergies, current medications, past family history, past medical history, past social history, past surgical history and problem list.     Review of Systems:  Pertinent items noted in HPI and remainder of comprehensive ROS otherwise negative.  Physical  Exam:   General:  Alert, oriented and cooperative.   Mental Status: Normal mood and affect perceived. Normal judgment and thought content.  Physical exam deferred due to nature of the encounter  Lab Results  Component Value Date   TSH 0.63 04/11/2019   TSH 1.160 08/02/2018   TSH 0.979 11/22/2016    Labs and Imaging No results found for this or any previous visit (from the past 336 hour(s)). US BREAST LTD UNI LEFT INC AXILLA  Result Date: 04/20/2019 CLINICAL DATA:  Patient presents for a diagnostic left breast evaluation due to a 1 week history spontaneous pinkish left nipple discharge. Patient also complains of focal tenderness over the high upper outer left breast. No palpable abnormality. Patient states a history of bilateral nipple discharge since her last pregnancy 2017. She states the discharge on the left side has always been whitish in color. Patient had a workup for dark brown right nipple discharge June 2020 with negative mammogram and ultrasound. Breast MRI was recommended, although not performed as the right nipple discharge has since resolved. Patient had surgical evaluation last week and was given prescription for antibiotics which she has not started yet. EXAM: DIGITAL DIAGNOSTIC left MAMMOGRAM WITH CAD AND TOMO ULTRASOUND left BREAST COMPARISON:  Previous exam(s). ACR Breast Density Category b: There are scattered areas of fibroglandular density. FINDINGS: Examination demonstrates no focal abnormality in the left retroareolar region to account for patient's left nipple discharge. No focal abnormality over the upper outer left breast to account for patient's focal tenderness.  Left breast is otherwise unchanged. Mammographic images were processed with CAD. On physical exam, I am unable to express any left nipple discharge. No focal palpable abnormality in the left retroareolar region. Targeted ultrasound is performed, showing no focal abnormality over the left retroareolar region to  account for patient's nipple discharge. Ultrasound over the high upper outer left breast demonstrates no focal abnormality to account for patient's focal tenderness. IMPRESSION: No focal mammographic or sonographic abnormality in the left retroareolar region to account for patient's reported pinkish left nipple discharge. No left nipple discharge visible on exam. No focal abnormality over the upper outer left breast to account for patient's focal tenderness. RECOMMENDATION: Recommend continued management of patient's left nipple discharge on a clinical basis. Recommend completion of the prescribed antibiotics and surgical follow-up as planned. If pinkish left nipple discharge persists, would recommend further evaluation with breast MRI. Also recommend continued management of patient's focal left breast tenderness on a clinical basis. I have discussed the findings and recommendations with the patient. If applicable, a reminder letter will be sent to the patient regarding the next appointment. BI-RADS CATEGORY  1: Negative. Electronically Signed   By: Elberta Fortis M.D.   On: 04/20/2019 15:38   MM DIAG BREAST TOMO UNI LEFT  Result Date: 04/20/2019 CLINICAL DATA:  Patient presents for a diagnostic left breast evaluation due to a 1 week history spontaneous pinkish left nipple discharge. Patient also complains of focal tenderness over the high upper outer left breast. No palpable abnormality. Patient states a history of bilateral nipple discharge since her last pregnancy 2017. She states the discharge on the left side has always been whitish in color. Patient had a workup for dark brown right nipple discharge June 2020 with negative mammogram and ultrasound. Breast MRI was recommended, although not performed as the right nipple discharge has since resolved. Patient had surgical evaluation last week and was given prescription for antibiotics which she has not started yet. EXAM: DIGITAL DIAGNOSTIC left MAMMOGRAM WITH  CAD AND TOMO ULTRASOUND left BREAST COMPARISON:  Previous exam(s). ACR Breast Density Category b: There are scattered areas of fibroglandular density. FINDINGS: Examination demonstrates no focal abnormality in the left retroareolar region to account for patient's left nipple discharge. No focal abnormality over the upper outer left breast to account for patient's focal tenderness. Left breast is otherwise unchanged. Mammographic images were processed with CAD. On physical exam, I am unable to express any left nipple discharge. No focal palpable abnormality in the left retroareolar region. Targeted ultrasound is performed, showing no focal abnormality over the left retroareolar region to account for patient's nipple discharge. Ultrasound over the high upper outer left breast demonstrates no focal abnormality to account for patient's focal tenderness. IMPRESSION: No focal mammographic or sonographic abnormality in the left retroareolar region to account for patient's reported pinkish left nipple discharge. No left nipple discharge visible on exam. No focal abnormality over the upper outer left breast to account for patient's focal tenderness. RECOMMENDATION: Recommend continued management of patient's left nipple discharge on a clinical basis. Recommend completion of the prescribed antibiotics and surgical follow-up as planned. If pinkish left nipple discharge persists, would recommend further evaluation with breast MRI. Also recommend continued management of patient's focal left breast tenderness on a clinical basis. I have discussed the findings and recommendations with the patient. If applicable, a reminder letter will be sent to the patient regarding the next appointment. BI-RADS CATEGORY  1: Negative. Electronically Signed   By: Elberta Fortis M.D.  On: 04/20/2019 15:38      Assessment and Plan:     1. Nipple discharge DDX includes a hormonal imbalance, premature ovarian failure (tho less likely given  continued menses), breast cancer including inflammatory vs ductal pathology.  exlcuding the breast sx that patient had overt hypothyroid sx but TSH has been normal.  Could this be an autoimmune process like lupus? We will case the net wide to review metabolic causes of sx and full evaluation of thyroid.  Overall encouraged patient doe her persistence and provided support regarding her boucning around to various providers.  - LH; Future - Follicle stimulating hormone; Future - Thyroid Peroxidase Antibodies (TPO) (REFL); Future - MR BREAST BILATERAL W CONTRAST; Future - Estrogens, Total; Future - Progesterone; Future - ANA; Future - Thyroid Stimulating Immunoglobulin; Future - Anti mullerian hormone; Future       I discussed the assessment and treatment plan with the patient. The patient was provided an opportunity to ask questions and all were answered. The patient agreed with the plan and demonstrated an understanding of the instructions.   The patient was advised to call back or seek an in-person evaluation/go to the ED if the symptoms worsen or if the condition fails to improve as anticipated.  I provided 25 minutes of non-face-to-face time during this encounter.  Non-Face to face time:  25 minutes  Greater than 50% of the visit time was spent in counseling and coordination of care with the patient.  The summary and outline of the counseling and care coordination is summarized in the note above.   All questions were answered.  Federico Flake, MD Center for [employer], [facility]

## 2019-05-21 ENCOUNTER — Encounter: Payer: Medicaid Other | Admitting: Family Medicine

## 2019-07-02 ENCOUNTER — Encounter: Payer: Self-pay | Admitting: Family Medicine

## 2019-07-02 ENCOUNTER — Other Ambulatory Visit: Payer: Self-pay

## 2019-07-02 ENCOUNTER — Encounter (INDEPENDENT_AMBULATORY_CARE_PROVIDER_SITE_OTHER): Payer: Medicaid Other | Admitting: Family Medicine

## 2019-07-02 ENCOUNTER — Other Ambulatory Visit: Payer: Self-pay | Admitting: Emergency Medicine

## 2019-07-03 MED ORDER — NORGESTIMATE-ETH ESTRADIOL 0.25-35 MG-MCG PO TABS: 1.0000 | ORAL_TABLET | Freq: Every day | ORAL | 0 refills | Status: DC

## 2019-07-11 NOTE — Progress Notes (Signed)
Error

## 2019-10-01 ENCOUNTER — Other Ambulatory Visit: Payer: Self-pay | Admitting: *Deleted

## 2019-10-01 MED ORDER — NORGESTIMATE-ETH ESTRADIOL 0.25-35 MG-MCG PO TABS: 1.0000 | ORAL_TABLET | Freq: Every day | ORAL | 2 refills | Status: DC

## 2020-08-01 IMAGING — US US BREAST*L* LIMITED INC AXILLA
1 series · 1 of 1 positions shown · non-contrast
Comparison: Previous exam(s).

CLINICAL DATA: Patient presents for a diagnostic left breast
evaluation due to a 1 week history spontaneous pinkish left nipple
discharge. Patient also complains of focal tenderness over the high
upper outer left breast. No palpable abnormality. Patient states a
history of bilateral nipple discharge since her last pregnancy [id number].
She states the discharge on the left side has always been whitish in
color. Patient had a workup for dark brown right nipple discharge
July 2018 with negative mammogram and ultrasound. Breast MRI was
recommended, although not performed as the right nipple discharge
has since resolved. Patient had surgical evaluation last week and
was given prescription for antibiotics which she has not started
yet.

EXAM:
DIGITAL DIAGNOSTIC left MAMMOGRAM WITH CAD AND TOMO
ULTRASOUND left BREAST

[Series 1: us breast*left* limited inc axilla · 0.07mm/px · 1 of 1 slices shown]
[im 1/1]
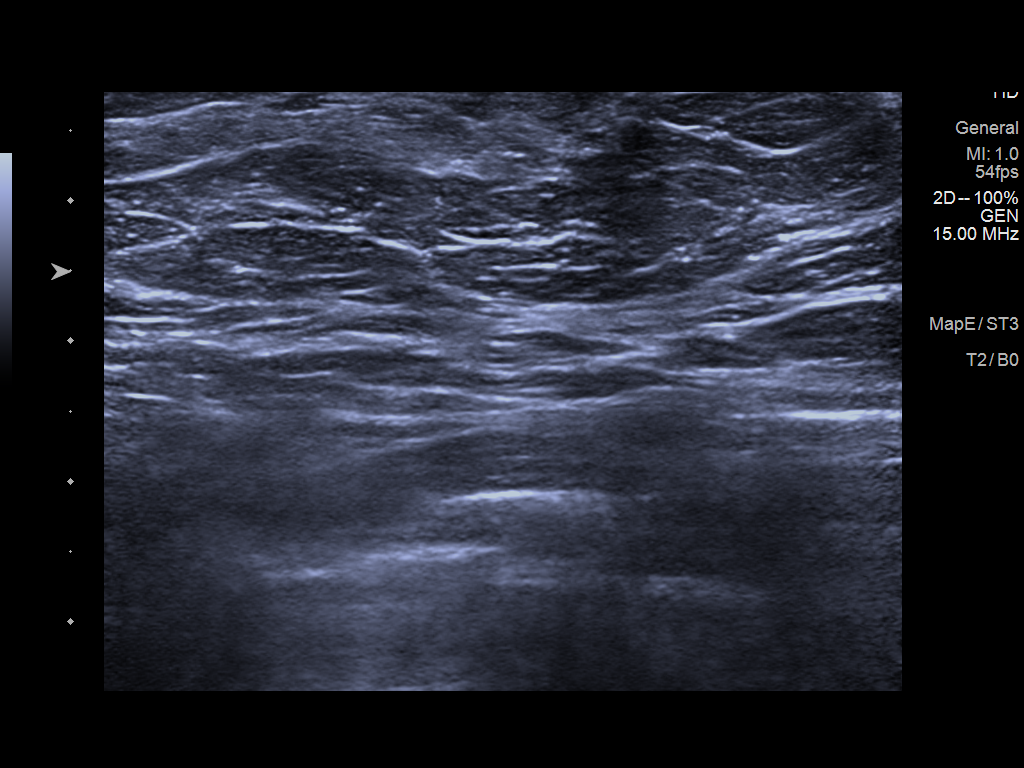

[1 of 1 positions shown; findings below may reference images not displayed]

ACR Breast Density Category b: There are scattered areas of
fibroglandular density.
FINDINGS: Examination demonstrates no focal abnormality in the left
retroareolar region to account for patient's left nipple discharge.
No focal abnormality over the upper outer left breast to account for
patient's focal tenderness. Left breast is otherwise unchanged.

Mammographic images were processed with CAD.

On physical exam, I am unable to express any left nipple discharge.
No focal palpable abnormality in the left retroareolar region.

Targeted ultrasound is performed, showing no focal abnormality over
the left retroareolar region to account for patient's nipple
discharge. Ultrasound over the high upper outer left breast
demonstrates no focal abnormality to account for patient's focal
tenderness.
IMPRESSION: No focal mammographic or sonographic abnormality in the left
retroareolar region to account for patient's reported pinkish left
nipple discharge. No left nipple discharge visible on exam. No focal
abnormality over the upper outer left breast to account for
patient's focal tenderness.

RECOMMENDATION:
Recommend continued management of patient's left nipple discharge on
a clinical basis. Recommend completion of the prescribed antibiotics
and surgical follow-up as planned. If pinkish left nipple discharge
persists, would recommend further evaluation with breast MRI. Also
recommend continued management of patient's focal left breast
tenderness on a clinical basis.

I have discussed the findings and recommendations with the patient.
If applicable, a reminder letter will be sent to the patient
regarding the next appointment.

BI-RADS CATEGORY  1: Negative.

## 2021-02-15 NOTE — L&D Delivery Note (Addendum)
OB/GYN Faculty Practice Delivery Note  Maria Pugh is a 36 y.o. [id number] s/p VD with eIOL at [redacted]w[redacted]d. She was admitted for eIOL.   ROM: 0h 83m with clear fluid GBS Status: negative Maximum Maternal Temperature: 98.6  Labor Progress: Presented for eIOL with pitocin, received epidural Spontaneous ROM with clear fluid  Delivery Date/Time: 01/02/2022 at 10:49 AM Delivery: Called to room and patient was complete and pushing. Head delivered ROA. No nuchal cord present. Shoulder and body delivered in usual fashion. Infant with spontaneous cry, placed on mother's abdomen, dried and stimulated. Cord clamped x 2 after 1-minute delay, and cut by FOB under my direct supervision. Cord blood drawn. Placenta delivered spontaneously with gentle cord traction. Fundus firm with massage and Pitocin. Labia, perineum, vagina, and cervix were inspected, only one periurethral abrasion noted on L side.   Placenta: complete, three vessel cord appreciated Complications: none Lacerations: none EBL: 227 mL Analgesia: epidural  Postpartum Planning [x]  message to sent to schedule follow-up  Declined flu and TDaP  Infant: APGARs 9 at 1 minute and 10 at 5 minutes  weight pending  , MD Center for [facility], [facility]    Fellow ATTESTATION  I was present and gloved for this delivery and agree with the above documentation in the resident's note except as below.  Uncomplicated vaginal delivery. TXA given prophylactically due to prior history of PPH. Average blood loss.  [name] [facility], MD/[name] (Faculty Practice) 01/02/2022, 11:24 AM

## 2021-05-19 ENCOUNTER — Ambulatory Visit: Payer: Medicaid Other | Admitting: Family Medicine

## 2021-05-19 ENCOUNTER — Encounter: Payer: Self-pay | Admitting: Family Medicine

## 2021-05-19 ENCOUNTER — Other Ambulatory Visit: Payer: Self-pay | Admitting: Family Medicine

## 2021-05-19 VITALS — BP 104/72 | HR 89 | Temp 98.1°F | Resp 16 | Ht 62.0 in | Wt 122.2 lb

## 2021-05-19 DIAGNOSIS — N912 Amenorrhea, unspecified: Secondary | ICD-10-CM | POA: Diagnosis not present

## 2021-05-19 DIAGNOSIS — R102 Pelvic and perineal pain: Secondary | ICD-10-CM

## 2021-05-19 DIAGNOSIS — Z8759 Personal history of other complications of pregnancy, childbirth and the puerperium: Secondary | ICD-10-CM

## 2021-05-19 DIAGNOSIS — Z3201 Encounter for pregnancy test, result positive: Secondary | ICD-10-CM

## 2021-05-19 LAB — POCT URINE PREGNANCY: Preg Test, Ur: POSITIVE — AB

## 2021-05-19 NOTE — Progress Notes (Signed)
? ? ?Patient ID: Maria Pugh, female    DOB: 28-Jun-1985, 36 y.o.   MRN: [id number] ? ?PCP: Danelle Berry, PA-C ? ?Chief Complaint  ?Patient presents with  ? Amenorrhea  ?  Pt LMP was in February, pt may believe she had a miscarriage or could be pregnant. Pt did 3 home test pregnancy all came out positive.  ? ? ?Subjective:  ? ?Maria Pugh is a 36 y.o. female, presents to clinic with CC of the following: ? ?HPI  ? ?Pt has been off and on birth control with difficulty getting meds with insurance coverage ?OCP switched beginning of the year, break through bleeding was unusually heavy in Feb  ?Multiple pregnancy tests positive  ?Bleeding heavy - middle of third week of OCP - Feb 17, bleeding ?2 weeks later nauseous and feeling weird and had a positive pregnancy test and stopped birth control ? ?[facility] - cannot get in for [facility] care until mid June ? ?Hx of hemorrage, placenta previa, some nausea  ? ? ? ? ? ? ? ?Patient Active Problem List  ? Diagnosis Date Noted  ? Hair thinning 04/11/2019  ? Discharge from left nipple 04/11/2019  ? Anxiety and depression 01/31/2017  ? Galactorrhea 11/15/2016  ? Abnormal uterine bleeding (AUB) 11/15/2016  ? Breakthrough bleeding on birth control pills 11/18/2015  ? History of abnormal cervical Pap smear 08/09/2014  ? ? ? ? ?Current Outpatient Medications:  ?  ketoconazole (NIZORAL) 2 % shampoo, Apply 1 application topically 2 (two) times a week., Disp: 120 mL, Rfl: 0 ?  Pediatric Multivit-Minerals-C (FLINTSTONES GUMMIES PO), Take 2 each by mouth at bedtime., Disp: , Rfl:  ?  triamcinolone ointment (KENALOG) 0.1 %, Apply 1 application topically 2 (two) times daily as needed. To dry patchy or thickened skin (not to face), Disp: 30 g, Rfl: 0 ?  norgestimate-ethinyl estradiol (ORTHO-CYCLEN) 0.25-35 MG-MCG tablet, Take 1 tablet by mouth daily. (Patient not taking: Reported on 05/19/2021), Disp: 84 tablet, Rfl: 2 ? ? ?No Known Allergies ? ? ?Social History   ? ?Tobacco Use  ? Smoking status: Never  ? Smokeless tobacco: Never  ?Vaping Use  ? Vaping Use: Never used  ?Substance Use Topics  ? Alcohol use: No  ? Drug use: No  ?  ? ? ?Chart Review Today: ?I personally reviewed active problem list, medication list, allergies, family history, social history, health maintenance, notes from last encounter, lab results, imaging with the patient/caregiver today. ? ? ?Review of Systems  ?Constitutional: Negative.   ?HENT: Negative.    ?Eyes: Negative.   ?Respiratory: Negative.    ?Cardiovascular: Negative.   ?Gastrointestinal: Negative.   ?Endocrine: Negative.   ?Genitourinary: Negative.   ?Musculoskeletal: Negative.   ?Skin: Negative.   ?Allergic/Immunologic: Negative.   ?Neurological: Negative.   ?Hematological: Negative.   ?Psychiatric/Behavioral: Negative.    ?All other systems reviewed and are negative. ? ?   ?Objective:  ? ?Vitals:  ? 05/19/21 0845  ?BP: 104/72  ?Pulse: 89  ?Resp: 16  ?Temp: 98.1 ?F (36.7 ?C)  ?TempSrc: Oral  ?SpO2: 100%  ?Weight: 122 lb 3.2 oz (55.4 kg)  ?Height: 5\' 2"  (1.575 m)  ?  ?Body mass index is 22.35 kg/m?. ? ?Physical Exam ?Vitals and nursing note reviewed.  ?Constitutional:   ?   General: She is not in acute distress. ?   Appearance: Normal appearance. She is well-developed. She is not ill-appearing, toxic-appearing or diaphoretic.  ?HENT:  ?  Head: Normocephalic and atraumatic.  ?   Nose: Nose normal.  ?Eyes:  ?   General:     ?   Right eye: No discharge.     ?   Left eye: No discharge.  ?   Conjunctiva/sclera: Conjunctivae normal.  ?Neck:  ?   Trachea: No tracheal deviation.  ?Cardiovascular:  ?   Rate and Rhythm: Normal rate and regular rhythm.  ?   Pulses: Normal pulses.  ?   Heart sounds: Normal heart sounds. No murmur heard. ?  No friction rub. No gallop.  ?Pulmonary:  ?   Effort: Pulmonary effort is normal. No respiratory distress.  ?   Breath sounds: Normal breath sounds. No stridor.  ?Abdominal:  ?   General: Bowel sounds are normal.  There is no distension.  ?   Palpations: Abdomen is soft.  ?   Tenderness: There is no abdominal tenderness.  ?Musculoskeletal:     ?   General: Normal range of motion.  ?Skin: ?   General: Skin is warm and dry.  ?   Findings: No rash.  ?Neurological:  ?   Mental Status: She is alert.  ?   Motor: No abnormal muscle tone.  ?   Coordination: Coordination normal.  ?Psychiatric:     ?   Attention and Perception: She is inattentive.     ?   Mood and Affect: Mood and affect normal.     ?   Behavior: Behavior is hyperactive.  ?  ? ?Results for orders placed or performed in visit on 05/19/21  ?POCT urine pregnancy  ?Result Value Ref Range  ? Preg Test, Ur Positive (A) Negative  ? ? ?   ?Assessment & Plan:  ? ?1. Amenorrhea ?Patient has been on birth control and the brand was recently changed but she did not miss any doses.  She started to feel ill and took a home pregnancy test which was positive so she then stopped her birth control and has been trying to get an appointment with an OB/GYN.  There are no appointments for more than 2 months and she is concerned having recently had a unintentional pregnancy with miscarriage with prolonged hCG levels reading high also concerned with prior high risk pregnancies with a placental abruption and placenta previa ?Point-of-care urine pregnancy was positive today, considering her complicated recent history we will get serum quantitative hCG levels and trend this week and try to get a pelvic early OB ultrasound to assist her with dating and reassuring that this is a intrauterine or viable pregnancy ?- POCT urine pregnancy ? ?2. Pregnancy test positive ? ?- hCG, quantitative, pregnancy; Future ? ?Patient is well-appearing, has not had any severe cramping no vaginal bleeding ?Encouraged her to keep her OB/GYN appointment that is already scheduled ?If she begins to have any vaginal bleeding or spotting she was instructed to go to the ER for more urgent assessment ? ? ? ? ? ?Danelle Berry,  PA-C ?05/19/21 9:09 AM ? ?

## 2021-05-20 LAB — HCG, QUANTITATIVE, PREGNANCY: HCG, Total, QN: 43695 m[IU]/mL

## 2021-05-21 ENCOUNTER — Other Ambulatory Visit: Payer: Self-pay | Admitting: Family Medicine

## 2021-05-21 ENCOUNTER — Telehealth: Payer: Self-pay

## 2021-05-21 DIAGNOSIS — Z3201 Encounter for pregnancy test, result positive: Secondary | ICD-10-CM | POA: Diagnosis not present

## 2021-05-21 NOTE — Telephone Encounter (Signed)
Spoke to pt about her concern, she states she will get her imaging done that was ordered by [place]. ?

## 2021-05-21 NOTE — Telephone Encounter (Signed)
Called pt no answer left vm to call back  °

## 2021-05-21 NOTE — Addendum Note (Signed)
Addended by: Danelle Berry on: 05/21/2021 08:36 AM ? ? Modules accepted: Orders ? ?

## 2021-05-21 NOTE — Telephone Encounter (Signed)
Pt is requesting a call back. She has a few questions about a letter. [phone]. ?

## 2021-05-21 NOTE — Telephone Encounter (Signed)
Pt returned call, stated she is in the hospital and requested a MyChart message be sent, please advise.  ?

## 2021-05-22 LAB — HCG, QUANTITATIVE, PREGNANCY: HCG, Total, QN: 57522 m[IU]/mL

## 2021-05-25 ENCOUNTER — Telehealth: Payer: Self-pay

## 2021-05-25 NOTE — Telephone Encounter (Signed)
Copied from CRM [id number]. Topic: General - Call Back - No Documentation >> May 25, 2021  1:09 PM Randol Kern wrote: Reason for CRM: Pt called regarding her insurance and says she wants to speak to the office about a claim. Please advise  [phone]

## 2021-05-25 NOTE — Telephone Encounter (Signed)
Spoke with patient and she just wanted to inform office to resubmit claims for the last 2 dates of service per her insurance because her coverage is active.

## 2021-06-02 ENCOUNTER — Ambulatory Visit
Admission: RE | Admit: 2021-06-02 | Discharge: 2021-06-02 | Disposition: A | Discharge status: Home / Self Care | Payer: Medicaid Other | Source: Ambulatory Visit | Attending: Family Medicine | Admitting: Family Medicine

## 2021-06-02 DIAGNOSIS — Z8759 Personal history of other complications of pregnancy, childbirth and the puerperium: Secondary | ICD-10-CM | POA: Insufficient documentation

## 2021-06-02 DIAGNOSIS — R102 Pelvic and perineal pain: Secondary | ICD-10-CM | POA: Diagnosis not present

## 2021-06-02 DIAGNOSIS — Z3201 Encounter for pregnancy test, result positive: Secondary | ICD-10-CM | POA: Insufficient documentation

## 2021-06-02 DIAGNOSIS — O26891 Other specified pregnancy related conditions, first trimester: Secondary | ICD-10-CM | POA: Diagnosis not present

## 2021-06-02 DIAGNOSIS — Z3A08 8 weeks gestation of pregnancy: Secondary | ICD-10-CM | POA: Diagnosis not present

## 2021-06-04 ENCOUNTER — Telehealth: Payer: Self-pay

## 2021-06-04 NOTE — Telephone Encounter (Signed)
Copied from CRM [id number]. Topic: General - Other >> Jun 04, 2021 12:02 PM Traci Sermon wrote: Reason for CRM: Pt called in wanting to get her [place] results, and requested a call back, please advise.

## 2021-06-05 ENCOUNTER — Encounter: Payer: Self-pay | Admitting: Family Medicine

## 2021-06-05 ENCOUNTER — Other Ambulatory Visit: Payer: Self-pay | Admitting: Family Medicine

## 2021-06-05 NOTE — Telephone Encounter (Signed)
Pt scheduled sooner w/ [facility] may 11 2:50, pt notified ?

## 2021-06-05 NOTE — Progress Notes (Signed)
Adding EDD per [place] dating ? ?US OB Comp Less 14 Wks ? ? ?Narrative & Impression  ?CLINICAL DATA:  Pelvic pain. ?  ?EXAM: ?OBSTETRIC <14 WK ULTRASOUND ?  ?TECHNIQUE: ?Transabdominal ultrasound was performed for evaluation of the ?gestation as well as the maternal uterus and adnexal regions. ?  ?COMPARISON:  None. ?  ?FINDINGS: ?Intrauterine gestational sac: Single ?  ?Yolk sac:  Visualized. ?  ?Embryo:  Visualized. ?  ?Cardiac Activity: Visualized. ?  ?Heart Rate: 173 bpm ?  ?MSD:  36.2 mm   8 w   6 d ?  ?CRL:   19.1 mm   8 w 3 d                  [place] [facility]: January 09, 2022 ?  ?Subchorionic hemorrhage:  None visualized. ?  ?Maternal uterus/adnexae: The right ovary is visualized and is normal ?in appearance. ?  ?A 1.9 cm x 1.6 cm x 1.9 cm corpus luteum cyst is seen within an ?otherwise normal appearing left ovary. ?  ?No pelvic free fluid is seen. ?  ?IMPRESSION: ?Single, viable intrauterine pregnancy at approximately 8 weeks and 3 ?days gestation by ultrasound evaluation. ?  ?  ?Electronically Signed ?  By: Aram Candela M.D. ?  On: 06/04/2021 16:43  ? ? ?Today pt EGA would be [redacted]w[redacted]d ?Per MDCalc: ?Due date ?Monday, Jan 11, 2022 ([place] states 01/09/2022) ?Last menstrual period (this was unknown) ?Monday, Apr 06, 2021 ?Date of conception ?Monday, Apr 20, 2021 ? ? ? ?Pt has OBGYN appt 06/[facility]/20[facility] ? ?Danelle Berry, PA-C ? ?

## 2021-06-05 NOTE — Progress Notes (Signed)
Positive preg in office - [place] confirms - updating pregnancy status - will have to open another encounter to add EDD ?

## 2021-06-05 NOTE — Addendum Note (Signed)
Addended by: Delsa Grana on: 06/05/2021 10:09 AM ? ? Modules accepted: Orders ? ?

## 2021-06-05 NOTE — Telephone Encounter (Signed)
Pt notified of [place] results please advise she can not get into OB until 15weeks and is worried about genetic testing? ?

## 2021-06-25 ENCOUNTER — Other Ambulatory Visit (HOSPITAL_COMMUNITY)
Admission: RE | Admit: 2021-06-25 | Discharge: 2021-06-25 | Disposition: A | Discharge status: Home / Self Care | Payer: Medicaid Other | Source: Ambulatory Visit | Attending: Obstetrics & Gynecology | Admitting: Obstetrics & Gynecology

## 2021-06-25 ENCOUNTER — Encounter: Payer: Self-pay | Admitting: Obstetrics & Gynecology

## 2021-06-25 ENCOUNTER — Ambulatory Visit (INDEPENDENT_AMBULATORY_CARE_PROVIDER_SITE_OTHER): Payer: Medicaid Other | Admitting: Obstetrics & Gynecology

## 2021-06-25 VITALS — BP 131/89 | HR 111 | Wt 119.0 lb

## 2021-06-25 DIAGNOSIS — Z8759 Personal history of other complications of pregnancy, childbirth and the puerperium: Secondary | ICD-10-CM | POA: Diagnosis not present

## 2021-06-25 DIAGNOSIS — Z3481 Encounter for supervision of other normal pregnancy, first trimester: Secondary | ICD-10-CM | POA: Diagnosis not present

## 2021-06-25 DIAGNOSIS — Z31430 Encounter of female for testing for genetic disease carrier status for procreative management: Secondary | ICD-10-CM | POA: Diagnosis not present

## 2021-06-25 DIAGNOSIS — Z3A11 11 weeks gestation of pregnancy: Secondary | ICD-10-CM | POA: Diagnosis not present

## 2021-06-25 DIAGNOSIS — Z3480 Encounter for supervision of other normal pregnancy, unspecified trimester: Secondary | ICD-10-CM | POA: Diagnosis not present

## 2021-06-25 HISTORY — DX: Personal history of other complications of pregnancy, childbirth and the puerperium: Z87.59

## 2021-06-25 MED ORDER — ASPIRIN EC 81 MG PO TBEC: 81.0000 mg | DELAYED_RELEASE_TABLET | Freq: Every day | ORAL | 2 refills | Status: DC

## 2021-06-25 MED ORDER — BLOOD PRESSURE KIT: 1.0000 | PACK | 0 refills | Status: DC

## 2021-06-25 NOTE — Addendum Note (Signed)
Addended by: Scheryl Marten on: 06/25/2021 04:16 PM ? ? Modules accepted: Orders ? ?

## 2021-06-25 NOTE — Progress Notes (Signed)
? ?History:  ? Maria Pugh is a 36 y.o. [id number] at [redacted]w[redacted]d by early ultrasound being seen today for her first obstetrical visit.  Her obstetrical history is significant for  [facility] as per patient during first pregnancy in 2011, did not have it in 2016-2017 pregnancy . Patient does intend to breast feed. Pregnancy history fully reviewed. ? ?Patient reports no complaints.  Accompanied by FOB. Wants testing for carrier states associated with autism, brother has autism and her daughter had some developmental issues that she overcame.   ?  ? ?  ?HISTORY: ?OB History  ?Gravida Para Term Preterm AB Living  ?4 2 2  0 1 2  ?SAB IAB Ectopic Multiple Live Births  ?1 0 0 0 2  ?  ?# Outcome Date GA Lbr Len/2nd Weight Sex Delivery Anes PTL Lv  ?4 Current           ?3 Term 03/15/15 [redacted]w[redacted]d 04:18 / 00:40 7 lb 0.4 oz (3.185 kg) F Vag-Spont EPI  LIV  ?   Name: LAKAISHA, DANISH  ?   Apgar1: 8  Apgar5: 9  ?2 Term 02/02/10 [redacted]w[redacted]d  6 lb 5 oz (2.863 kg) M Vag-Spont EPI N LIV  ?1 SAB           ?  ?Obstetric Comments  ?1st Menstrual Cycle:  10   ?1st Pregnancy:  25  ?  ?Last pap smear was done 08/02/2018 and was normal ? ?Past Medical History:  ?Diagnosis Date  ? Abnormal uterine bleeding (AUB) 11/15/2016  ? Anxiety   ? Depression   ? Discharge from left nipple 04/11/2019  ? Galactorrhea   ? Gestational hypertension   ? History of abnormal cervical Pap smear 08/09/2014  ? LEEP. Follow-up Paps normal.  ? Pancreatitis   ? Shingles 2013 and 2014  ? ?Past Surgical History:  ?Procedure Laterality Date  ? LEEP    ? MOUTH SURGERY  07/2014  ? ?Family History  ?Problem Relation Age of Onset  ? Mental illness Mother   ? Thyroid disease Mother   ? Graves' disease Mother   ? Bipolar disorder Mother   ? Diabetes Paternal Grandmother   ? Pancreatic cancer Paternal Grandmother   ? Autism Brother   ? Hashimoto's thyroiditis Brother   ? Acromegaly Cousin   ? Hypertension Father   ? Stroke Father   ? Clotting disorder Father   ? Heart attack Father   ?  Developmental delay Daughter   ? Lung cancer Maternal Grandfather   ? Lung disease Paternal Grandfather   ? Kidney disease Paternal Grandfather   ? Breast cancer Neg Hx   ? ?Social History  ? ?Tobacco Use  ? Smoking status: Never  ? Smokeless tobacco: Never  ?Vaping Use  ? Vaping Use: Never used  ?Substance Use Topics  ? Alcohol use: No  ? Drug use: No  ? ?No Known Allergies ?Current Outpatient Medications on File Prior to Visit  ?Medication Sig Dispense Refill  ? Pediatric Multivit-Minerals-C (FLINTSTONES GUMMIES PO) Take 2 each by mouth at bedtime.    ? ?No current facility-administered medications on file prior to visit.  ? ? ?Review of Systems ?Pertinent items noted in HPI and remainder of comprehensive ROS otherwise negative. ? ?Physical Exam:  ? ?Vitals:  ? 06/25/21 1517  ?BP: 131/89  ?Pulse: (!) 111  ?Weight: 119 lb (54 kg)  ? ?Fetal Heart Rate (bpm): 160   ?General: well-developed, well-nourished female in no acute distress  ?Breasts:  normal appearance, no  masses or tenderness bilaterally, exam done in the presence of a chaperone.   ?Skin: normal coloration and turgor, no rashes  ?Neurologic: oriented, normal, negative, normal mood  ?Extremities: normal strength, tone, and muscle mass, ROM of all joints is normal  ?HEENT PERRLA, extraocular movement intact and sclera clear, anicteric  ?Neck supple and no masses  ?Cardiovascular: regular rate and rhythm  ?Respiratory:  no respiratory distress, normal breath sounds  ?Abdomen: soft, non-tender; bowel sounds normal; no masses,  no organomegaly  ?Pelvic: normal external genitalia, no lesions, normal vaginal mucosa, normal vaginal discharge, normal cervix, pap smear done. Exam done in the presence of a chaperone.   ?  ?Assessment:  ?  ?Pregnancy: [id number] ?Patient Active Problem List  ? Diagnosis Date Noted  ? Supervision of other normal pregnancy, antepartum 06/25/2021  ? History of gestational hypertension 06/25/2021  ? Anxiety and depression 01/31/2017  ? ?   ?Plan:  ?  ?1. History of gestational hypertension ?Will send BP cuff from pharmacy to her home.  ASA prescribed, to start at 12 weeks ?- aspirin EC 81 MG tablet; Take 1 tablet (81 mg total) by mouth daily. Take after 12 weeks for prevention of preeclampsia later in pregnancy  Dispense: 300 tablet; Refill: 2 ?- Protein / creatinine ratio, urine ?- Comprehensive metabolic panel ? ?2. [redacted] weeks gestation of pregnancy ?3. Supervision of other normal pregnancy, antepartum ?- aspirin EC 81 MG tablet; Take 1 tablet (81 mg total) by mouth daily. Take after 12 weeks for prevention of preeclampsia later in pregnancy  Dispense: 300 tablet; Refill: 2 ?- CBC/D/Plt+RPR+Rh+ABO+RubIgG... ?- Culture, OB Urine ?- Genetic Screening ?- Comprehensive metabolic panel ?- Hemoglobin A1c ?- TSH Rfx on Abnormal to Free T4 ?- Cytology - PAP ?- [place] MFM OB COMP + 14 WK; Future ?- Enroll Patient in PreNatal Babyscripts ?Initial labs drawn. ?Continue prenatal vitamins. ?Problem list reviewed and updated. ?Genetic Screening discussed, Panorama and Horizon (14 pan ethnic panel): ordered. ?Ultrasound discussed; fetal anatomic survey: ordered. ?Anticipatory guidance about prenatal visits given including labs, ultrasounds, and testing. ?Discussed usage of the Babyscripts app for more information about pregnancy, and to track blood pressures. ?Also discussed usage of virtual visits as additional source of managing and completing prenatal visits.   ?Patient was encouraged to use MyChart to review results, send requests, and have questions addressed.   ?The nature of [place] [facility] for [facility]/Faculty Practice with multiple MDs and Advanced Practice Providers was explained to patient; also emphasized that residents, students are part of our team. ?Routine obstetric precautions reviewed. Encouraged to seek out care at office or emergency room [facility] MAU preferred) for urgent and/or emergent concerns. ?Return in about 4 weeks (around  07/23/2021) for OFFICE OB VISIT (MD or APP).  ?  ? ?Jaynie Collins, MD, FACOG ?Obstetrician Heritage manager, Faculty Practice ?Center for [employer], [facility] ?

## 2021-06-26 ENCOUNTER — Encounter: Payer: Self-pay | Admitting: Obstetrics & Gynecology

## 2021-06-26 DIAGNOSIS — Z3480 Encounter for supervision of other normal pregnancy, unspecified trimester: Secondary | ICD-10-CM | POA: Diagnosis not present

## 2021-06-26 LAB — COMPREHENSIVE METABOLIC PANEL
ALT: 16 IU/L (ref 0–32)
AST: 16 IU/L (ref 0–40)
Albumin/Globulin Ratio: 2 (ref 1.2–2.2)
Albumin: 4.1 g/dL (ref 3.8–4.8)
Alkaline Phosphatase: 51 IU/L (ref 44–121)
BUN/Creatinine Ratio: 14 (ref 9–23)
BUN: 7 mg/dL (ref 6–20)
Bilirubin Total: <0.2 mg/dL (ref 0.0–1.2)
CO2: 22 mmol/L (ref 20–29)
Calcium: 9.2 mg/dL (ref 8.7–10.2)
Chloride: 100 mmol/L (ref 96–106)
Creatinine, Ser: 0.5 mg/dL — ABNORMAL LOW (ref 0.57–1.00)
Globulin, Total: 2.1 g/dL (ref 1.5–4.5)
Glucose: 81 mg/dL (ref 70–99)
Potassium: 3.6 mmol/L (ref 3.5–5.2)
Sodium: 137 mmol/L (ref 134–144)
Total Protein: 6.2 g/dL (ref 6.0–8.5)
eGFR: 126 mL/min/{1.73_m2} (ref >59)

## 2021-06-26 LAB — PROTEIN / CREATININE RATIO, URINE
Creatinine, Urine: 194.8 mg/dL
Protein, Ur: 12.8 mg/dL
Protein/Creat Ratio: 66 mg/g creat (ref 0–200)

## 2021-06-26 LAB — TSH RFX ON ABNORMAL TO FREE T4: TSH: 0.962 u[IU]/mL (ref 0.450–4.500)

## 2021-06-27 LAB — CULTURE, OB URINE

## 2021-06-27 LAB — URINE CULTURE, OB REFLEX

## 2021-06-28 LAB — CBC/D/PLT+RPR+RH+ABO+RUBIGG...
Antibody Screen: NEGATIVE
Basophils Absolute: 0 10*3/uL (ref 0.0–0.2)
Basos: 0 %
EOS (ABSOLUTE): 0.1 10*3/uL (ref 0.0–0.4)
Eos: 1 %
HCV Ab: NONREACTIVE
HIV Screen 4th Generation wRfx: REACTIVE
Hematocrit: 36.4 % (ref 34.0–46.6)
Hemoglobin: 12.7 g/dL (ref 11.1–15.9)
Hepatitis B Surface Ag: NEGATIVE
Immature Grans (Abs): 0.1 10*3/uL (ref 0.0–0.1)
Immature Granulocytes: 1 %
Lymphocytes Absolute: 2.7 10*3/uL (ref 0.7–3.1)
Lymphs: 28 %
MCH: 32.5 pg (ref 26.6–33.0)
MCHC: 34.9 g/dL (ref 31.5–35.7)
MCV: 93 fL (ref 79–97)
Monocytes Absolute: 0.6 10*3/uL (ref 0.1–0.9)
Monocytes: 6 %
Neutrophils Absolute: 6.2 10*3/uL (ref 1.4–7.0)
Neutrophils: 64 %
Platelets: 337 10*3/uL (ref 150–450)
RBC: 3.91 x10E6/uL (ref 3.77–5.28)
RDW: 12.1 % (ref 11.7–15.4)
RPR Ser Ql: NONREACTIVE
Rh Factor: POSITIVE
Rubella Antibodies, IGG: 4.34 index (ref >0.99)
WBC: 9.6 10*3/uL (ref 3.4–10.8)

## 2021-06-28 LAB — HCV INTERPRETATION

## 2021-06-28 LAB — HIV 1/2 AB DIFFERENTIATION
HIV 1 Ab: NONREACTIVE
HIV 2 Ab: NONREACTIVE
NOTE (HIV CONF MULTIP: NEGATIVE

## 2021-06-28 LAB — HIV-1/HIV-2 QUALITATIVE RNA
Final Interpretation: NEGATIVE
HIV-1 RNA, Qualitative: NONREACTIVE
HIV-2 RNA, Qualitative: NONREACTIVE

## 2021-06-28 LAB — HEMOGLOBIN A1C
Est. average glucose Bld gHb Est-mCnc: 94 mg/dL
Hgb A1c MFr Bld: 4.9 % (ref 4.8–5.6)

## 2021-06-30 LAB — CYTOLOGY - PAP
Chlamydia: NEGATIVE
Comment: NEGATIVE
Comment: NEGATIVE
Comment: NEGATIVE
Comment: NORMAL
Diagnosis: NEGATIVE
Diagnosis: REACTIVE
High risk HPV: NEGATIVE
Neisseria Gonorrhea: NEGATIVE
Trichomonas: NEGATIVE

## 2021-07-06 ENCOUNTER — Encounter: Payer: Self-pay | Admitting: *Deleted

## 2021-07-23 ENCOUNTER — Ambulatory Visit (INDEPENDENT_AMBULATORY_CARE_PROVIDER_SITE_OTHER): Payer: Medicaid Other | Admitting: Obstetrics and Gynecology

## 2021-07-23 ENCOUNTER — Encounter: Payer: Self-pay | Admitting: Obstetrics and Gynecology

## 2021-07-23 VITALS — BP 115/76 | HR 102 | Wt 124.0 lb

## 2021-07-23 DIAGNOSIS — O3442 Maternal care for other abnormalities of cervix, second trimester: Secondary | ICD-10-CM | POA: Insufficient documentation

## 2021-07-23 DIAGNOSIS — Z3A15 15 weeks gestation of pregnancy: Secondary | ICD-10-CM

## 2021-07-23 DIAGNOSIS — Z9889 Other specified postprocedural states: Secondary | ICD-10-CM

## 2021-07-23 DIAGNOSIS — Z3480 Encounter for supervision of other normal pregnancy, unspecified trimester: Secondary | ICD-10-CM | POA: Diagnosis not present

## 2021-07-23 DIAGNOSIS — Z8759 Personal history of other complications of pregnancy, childbirth and the puerperium: Secondary | ICD-10-CM

## 2021-07-23 NOTE — Progress Notes (Signed)
   PRENATAL VISIT NOTE  Subjective:  Maria Pugh is a 36 y.o. [phone] at [redacted]w[redacted]d being seen today for ongoing prenatal care.  She is currently monitored for the following issues for this high-risk pregnancy and has Anxiety and depression; Supervision of other normal pregnancy, antepartum; History of gestational hypertension; and H/O LEEP (loop electrosurgical excision procedure) of cervix complicating pregnancy, second trimester on their problem list.  Patient reports no complaints.  Contractions: Not present. Vag. Bleeding: None.  Movement: Present. Denies leaking of fluid.   The following portions of the patient's history were reviewed and updated as appropriate: allergies, current medications, past family history, past medical history, past social history, past surgical history and problem list.   Objective:   Vitals:   07/23/21 1554  BP: 115/76  Pulse: (!) 102  Weight: 124 lb (56.2 kg)    Fetal Status: Fetal Heart Rate (bpm): 157   Movement: Present     General:  Alert, oriented and cooperative. Patient is in no acute distress.  Skin: Skin is warm and dry. No rash noted.   Cardiovascular: Normal heart rate noted  Respiratory: Normal respiratory effort, no problems with respiration noted  Abdomen: Soft, gravid, appropriate for gestational age.  Pain/Pressure: Absent     Pelvic: Cervical exam deferred        Extremities: Normal range of motion.  Edema: None  Mental Status: Normal mood and affect. Normal behavior. Normal judgment and thought content.   Assessment and Plan:  Pregnancy: [id number] at [redacted]w[redacted]d 1. Supervision of other normal pregnancy, antepartum Anatomy u/s already scheduled - AFP, Serum, Open Spina Bifida  2. [redacted] weeks gestation of pregnancy  3. H/O LEEP (loop electrosurgical excision procedure) of cervix complicating pregnancy, second trimester ? History. Follow up Cx length at anatomy  4. History of gestational hypertension Confirms on low dose asa  Preterm  labor symptoms and general obstetric precautions including but not limited to vaginal bleeding, contractions, leaking of fluid and fetal movement were reviewed in detail with the patient. Please refer to After Visit Summary for other counseling recommendations.   No follow-ups on file.  Future Appointments  Date Time Provider [place]  08/19/2021  2:45 PM [facility] US4 [facility]  08/20/2021  4:10 PM Aletha Halim, MD [facility]  09/17/2021  3:50 PM Aletha Halim, MD [facility]  10/15/2021  3:50 PM Aletha Halim, MD [facility]    Aletha Halim, MD

## 2021-07-25 LAB — AFP, SERUM, OPEN SPINA BIFIDA
AFP MoM: 0.94
AFP Value: 34.4 ng/mL
Gest. Age on Collection Date: 15.5 weeks
Maternal Age At EDD: 34.5 yr
OSBR Risk 1 IN: 10000
Test Results:: NEGATIVE
Weight: 124 [lb_av]

## 2021-08-03 ENCOUNTER — Encounter: Payer: Medicaid Other | Admitting: Family Medicine

## 2021-08-19 ENCOUNTER — Ambulatory Visit: Payer: Medicaid Other | Attending: Obstetrics & Gynecology

## 2021-08-19 ENCOUNTER — Other Ambulatory Visit: Payer: Self-pay | Admitting: *Deleted

## 2021-08-19 ENCOUNTER — Other Ambulatory Visit: Payer: Self-pay | Admitting: Obstetrics & Gynecology

## 2021-08-19 DIAGNOSIS — Z3480 Encounter for supervision of other normal pregnancy, unspecified trimester: Secondary | ICD-10-CM | POA: Diagnosis not present

## 2021-08-19 DIAGNOSIS — Z3A11 11 weeks gestation of pregnancy: Secondary | ICD-10-CM

## 2021-08-19 DIAGNOSIS — Z363 Encounter for antenatal screening for malformations: Secondary | ICD-10-CM | POA: Diagnosis not present

## 2021-08-19 DIAGNOSIS — Z3A19 19 weeks gestation of pregnancy: Secondary | ICD-10-CM | POA: Diagnosis not present

## 2021-08-19 DIAGNOSIS — O09292 Supervision of pregnancy with other poor reproductive or obstetric history, second trimester: Secondary | ICD-10-CM | POA: Insufficient documentation

## 2021-08-19 DIAGNOSIS — Z8759 Personal history of other complications of pregnancy, childbirth and the puerperium: Secondary | ICD-10-CM

## 2021-08-19 DIAGNOSIS — O3442 Maternal care for other abnormalities of cervix, second trimester: Secondary | ICD-10-CM

## 2021-08-20 ENCOUNTER — Ambulatory Visit (INDEPENDENT_AMBULATORY_CARE_PROVIDER_SITE_OTHER): Payer: Medicaid Other | Admitting: Obstetrics and Gynecology

## 2021-08-20 VITALS — BP 113/74 | HR 106 | Wt 131.0 lb

## 2021-08-20 DIAGNOSIS — R519 Headache, unspecified: Secondary | ICD-10-CM

## 2021-08-20 DIAGNOSIS — O4440 Low lying placenta NOS or without hemorrhage, unspecified trimester: Secondary | ICD-10-CM

## 2021-08-20 DIAGNOSIS — O3442 Maternal care for other abnormalities of cervix, second trimester: Secondary | ICD-10-CM

## 2021-08-20 DIAGNOSIS — Z3A19 19 weeks gestation of pregnancy: Secondary | ICD-10-CM

## 2021-08-20 DIAGNOSIS — Z9889 Other specified postprocedural states: Secondary | ICD-10-CM

## 2021-08-20 DIAGNOSIS — Z8759 Personal history of other complications of pregnancy, childbirth and the puerperium: Secondary | ICD-10-CM

## 2021-08-20 DIAGNOSIS — O26892 Other specified pregnancy related conditions, second trimester: Secondary | ICD-10-CM

## 2021-08-20 MED ORDER — MAGNESIUM OXIDE 400 240 MG PO PACK: 1.0000 | PACK | Freq: Every day | ORAL | 0 refills | Status: DC | PRN

## 2021-08-20 NOTE — Progress Notes (Signed)
   PRENATAL VISIT NOTE  Subjective:  Maria Pugh is a 36 y.o. [phone] at [redacted]w[redacted]d being seen today for ongoing prenatal care.  She is currently monitored for the following issues for this low-risk pregnancy and has Anxiety and depression; Supervision of other normal pregnancy, antepartum; History of gestational hypertension; and H/O LEEP (loop electrosurgical excision procedure) of cervix complicating pregnancy, second trimester on their problem list.  Patient reports  having occasional HAs .  Contractions: Not present. Vag. Bleeding: None.  Movement: Present. Denies leaking of fluid.   The following portions of the patient's history were reviewed and updated as appropriate: allergies, current medications, past family history, past medical history, past social history, past surgical history and problem list.   Objective:   Vitals:   08/20/21 1451  BP: 113/74  Pulse: (!) 106  Weight: 131 lb (59.4 kg)    Fetal Status: Fetal Heart Rate (bpm): 155   Movement: Present     General:  Alert, oriented and cooperative. Patient is in no acute distress.  Skin: Skin is warm and dry. No rash noted.   Cardiovascular: Normal heart rate noted  Respiratory: Normal respiratory effort, no problems with respiration noted  Abdomen: Soft, gravid, appropriate for gestational age.  Pain/Pressure: Absent     Pelvic: Cervical exam deferred        Extremities: Normal range of motion.  Edema: None  Mental Status: Normal mood and affect. Normal behavior. Normal judgment and thought content.   Assessment and Plan:  Pregnancy: [id number] at [redacted]w[redacted]d 1. [redacted] weeks gestation of pregnancy Normal anatomy u/s  2. Low-lying placenta D/w her and recommend pelvic rest until resolved  3. Pregnancy headache in second trimester MgOx qday PRN  4. History of gestational hypertension  5. H/O LEEP (loop electrosurgical excision procedure) of cervix complicating pregnancy, second trimester Normal cx length on  anatomy  Preterm labor symptoms and general obstetric precautions including but not limited to vaginal bleeding, contractions, leaking of fluid and fetal movement were reviewed in detail with the patient. Please refer to After Visit Summary for other counseling recommendations.   No follow-ups on file.  Future Appointments  Date Time Provider Department Center  08/20/2021  4:10 PM [place] Bing, MD [facility]  09/16/2021  3:15 PM [facility] NURSE [facility]  09/16/2021  3:30 PM [facility] US2 [facility]  09/17/2021  3:50 PM [place] Bing, MD [facility]  10/15/2021  3:50 PM [place] Bing, MD [facility]    [place] Bing, MD

## 2021-09-16 ENCOUNTER — Ambulatory Visit: Payer: Medicaid Other | Admitting: *Deleted

## 2021-09-16 ENCOUNTER — Ambulatory Visit: Payer: Medicaid Other | Attending: Obstetrics

## 2021-09-16 VITALS — BP 114/73 | HR 97

## 2021-09-16 DIAGNOSIS — O3442 Maternal care for other abnormalities of cervix, second trimester: Secondary | ICD-10-CM | POA: Insufficient documentation

## 2021-09-16 DIAGNOSIS — O09292 Supervision of pregnancy with other poor reproductive or obstetric history, second trimester: Secondary | ICD-10-CM | POA: Diagnosis not present

## 2021-09-16 DIAGNOSIS — Z87410 Personal history of cervical dysplasia: Secondary | ICD-10-CM

## 2021-09-16 DIAGNOSIS — Z3A23 23 weeks gestation of pregnancy: Secondary | ICD-10-CM | POA: Diagnosis not present

## 2021-09-16 DIAGNOSIS — O4442 Low lying placenta NOS or without hemorrhage, second trimester: Secondary | ICD-10-CM | POA: Insufficient documentation

## 2021-09-16 DIAGNOSIS — O99891 Other specified diseases and conditions complicating pregnancy: Secondary | ICD-10-CM

## 2021-09-16 DIAGNOSIS — Z3480 Encounter for supervision of other normal pregnancy, unspecified trimester: Secondary | ICD-10-CM

## 2021-09-16 DIAGNOSIS — O358XX0 Maternal care for other (suspected) fetal abnormality and damage, not applicable or unspecified: Secondary | ICD-10-CM

## 2021-09-16 DIAGNOSIS — Z8759 Personal history of other complications of pregnancy, childbirth and the puerperium: Secondary | ICD-10-CM | POA: Insufficient documentation

## 2021-09-16 DIAGNOSIS — Z362 Encounter for other antenatal screening follow-up: Secondary | ICD-10-CM | POA: Diagnosis not present

## 2021-09-17 ENCOUNTER — Other Ambulatory Visit: Payer: Self-pay | Admitting: *Deleted

## 2021-09-17 ENCOUNTER — Ambulatory Visit (INDEPENDENT_AMBULATORY_CARE_PROVIDER_SITE_OTHER): Payer: Medicaid Other | Admitting: Obstetrics and Gynecology

## 2021-09-17 VITALS — BP 131/74 | HR 108 | Wt 139.0 lb

## 2021-09-17 DIAGNOSIS — Z8759 Personal history of other complications of pregnancy, childbirth and the puerperium: Secondary | ICD-10-CM

## 2021-09-17 DIAGNOSIS — Z3480 Encounter for supervision of other normal pregnancy, unspecified trimester: Secondary | ICD-10-CM

## 2021-09-17 DIAGNOSIS — O09299 Supervision of pregnancy with other poor reproductive or obstetric history, unspecified trimester: Secondary | ICD-10-CM

## 2021-09-17 DIAGNOSIS — Z9889 Other specified postprocedural states: Secondary | ICD-10-CM

## 2021-09-17 DIAGNOSIS — O3442 Maternal care for other abnormalities of cervix, second trimester: Secondary | ICD-10-CM

## 2021-09-17 DIAGNOSIS — Z3A23 23 weeks gestation of pregnancy: Secondary | ICD-10-CM

## 2021-09-17 NOTE — Progress Notes (Signed)
ROB [redacted]w[redacted]d  CC: Back Pain   Pt and husband states yesterday at U/S appt she was told she was having contractions.pt states she did not notice any discomfort and is fine today.

## 2021-09-17 NOTE — Progress Notes (Signed)
   PRENATAL VISIT NOTE  Subjective:  Maria Pugh is a 36 y.o. [phone] at [redacted]w[redacted]d being seen today for ongoing prenatal care.  She is currently monitored for the following issues for this low-risk pregnancy and has Anxiety and depression; Supervision of other normal pregnancy, antepartum; History of gestational hypertension; and H/O LEEP (loop electrosurgical excision procedure) of cervix complicating pregnancy, second trimester on their problem list.  Patient reports no complaints.  Contractions: Not present. Vag. Bleeding: None.  Movement: Present. Denies leaking of fluid.   The following portions of the patient's history were reviewed and updated as appropriate: allergies, current medications, past family history, past medical history, past social history, past surgical history and problem list.   Objective:   Vitals:   09/17/21 1544  BP: 131/74  Pulse: (!) 108  Weight: 139 lb (63 kg)    Fetal Status: Fetal Heart Rate (bpm): 152 Fundal Height: 22 cm Movement: Present     General:  Alert, oriented and cooperative. Patient is in no acute distress.  Skin: Skin is warm and dry. No rash noted.   Cardiovascular: Normal heart rate noted  Respiratory: Normal respiratory effort, no problems with respiration noted  Abdomen: Soft, gravid, appropriate for gestational age.  Pain/Pressure: Present     Pelvic: Cervical exam deferred        Extremities: Normal range of motion.  Edema: None  Mental Status: Normal mood and affect. Normal behavior. Normal judgment and thought content.   Assessment and Plan:  Pregnancy: [id number] at [redacted]w[redacted]d 1. H/O LEEP (loop electrosurgical excision procedure) of cervix complicating pregnancy, second trimester Normal cx lengths  2. Supervision of other normal pregnancy, antepartum Gtt next visit  3. History of gestational hypertension Continue low dose asa  Preterm labor symptoms and general obstetric precautions including but not limited to vaginal bleeding,  contractions, leaking of fluid and fetal movement were reviewed in detail with the patient. Please refer to After Visit Summary for other counseling recommendations.   Return in about 3 weeks (around 10/08/2021) for in person, low risk ob, fasting 2hr GTT.  Future Appointments  Date Time Provider Department Center  10/15/2021  8:45 AM [facility] LAB [facility]  10/15/2021  9:15 AM [place] Bing, MD [facility]  11/18/2021  9:15 AM [facility] NURSE [facility]  11/18/2021  9:30 AM [facility] US3 [facility]    [place] Bing, MD

## 2021-10-13 DIAGNOSIS — Z1388 Encounter for screening for disorder due to exposure to contaminants: Secondary | ICD-10-CM | POA: Diagnosis not present

## 2021-10-15 ENCOUNTER — Ambulatory Visit (INDEPENDENT_AMBULATORY_CARE_PROVIDER_SITE_OTHER): Payer: Medicaid Other | Admitting: Obstetrics and Gynecology

## 2021-10-15 ENCOUNTER — Other Ambulatory Visit: Payer: Medicaid Other

## 2021-10-15 ENCOUNTER — Encounter: Payer: Medicaid Other | Admitting: Obstetrics and Gynecology

## 2021-10-15 VITALS — BP 123/79 | HR 93 | Wt 145.0 lb

## 2021-10-15 DIAGNOSIS — Z9889 Other specified postprocedural states: Secondary | ICD-10-CM

## 2021-10-15 DIAGNOSIS — Z3A27 27 weeks gestation of pregnancy: Secondary | ICD-10-CM

## 2021-10-15 DIAGNOSIS — Z3480 Encounter for supervision of other normal pregnancy, unspecified trimester: Secondary | ICD-10-CM

## 2021-10-15 DIAGNOSIS — Z3482 Encounter for supervision of other normal pregnancy, second trimester: Secondary | ICD-10-CM | POA: Diagnosis not present

## 2021-10-15 DIAGNOSIS — Z8759 Personal history of other complications of pregnancy, childbirth and the puerperium: Secondary | ICD-10-CM

## 2021-10-15 DIAGNOSIS — O3442 Maternal care for other abnormalities of cervix, second trimester: Secondary | ICD-10-CM

## 2021-10-15 NOTE — Progress Notes (Signed)
ROB [redacted]w[redacted]d  Flu Vaccine : Declined  Tdap Declined  CC: Heartburn , notes HA's drinking water daily.  Notes pulse will be high at home states a few B/P reading were in the 130s.  Feels as if her heart is racing all the time and hard to breath even without activity can occur first thing in the morning.   Pt also concerned about baby's position.

## 2021-10-15 NOTE — Progress Notes (Signed)
   PRENATAL VISIT NOTE  Subjective:  Maria Pugh is a 36 y.o. [phone] at [redacted]w[redacted]d being seen today for ongoing prenatal care.  She is currently monitored for the following issues for this low-risk pregnancy and has Anxiety and depression; Supervision of other normal pregnancy, antepartum; History of gestational hypertension; and H/O LEEP (loop electrosurgical excision procedure) of cervix complicating pregnancy, second trimester on their problem list.  Patient reports no complaints.  Contractions: Not present. Vag. Bleeding: None.  Movement: Present. Denies leaking of fluid.   The following portions of the patient's history were reviewed and updated as appropriate: allergies, current medications, past family history, past medical history, past social history, past surgical history and problem list.   Objective:   Vitals:   10/15/21 0855  BP: 123/79  Pulse: 93  Weight: 145 lb (65.8 kg)    Fetal Status: Fetal Heart Rate (bpm): 144   Movement: Present     General:  Alert, oriented and cooperative. Patient is in no acute distress.  Skin: Skin is warm and dry. No rash noted.   Cardiovascular: Normal heart rate noted. Normal s1 and s2, no MRGs  Respiratory: Normal respiratory effort, no problems with respiration noted  Abdomen: Soft, gravid, appropriate for gestational age.  Pain/Pressure: Absent     Pelvic: Cervical exam deferred        Extremities: Normal range of motion.  Edema: None  Mental Status: Normal mood and affect. Normal behavior. Normal judgment and thought content.   Assessment and Plan:  Pregnancy: [id number] at [redacted]w[redacted]d 1. Supervision of other normal pregnancy, antepartum 28wk labs today Concerns addressed in the morning and d/w her re: needing to take it slow with ambulation due to slow venous return due to pregnancy, staying well hydrated, etc.  2. [redacted] weeks gestation of pregnancy  3. H/O LEEP (loop electrosurgical excision procedure) of cervix complicating pregnancy,  second trimester Normal cervical lengths  4. History of gestational hypertension Continue low dose asa.   Preterm labor symptoms and general obstetric precautions including but not limited to vaginal bleeding, contractions, leaking of fluid and fetal movement were reviewed in detail with the patient. Please refer to After Visit Summary for other counseling recommendations.   No follow-ups on file.  Future Appointments  Date Time Provider Department Center  10/27/2021  8:35 AM Anyanwu, Jethro Bastos, MD [facility]  11/18/2021  9:15 AM [facility] NURSE [facility]  11/18/2021  9:30 AM [facility] US3 [facility]  11/24/2021 10:35 AM [place] Bing, MD [facility]  12/08/2021 10:35 AM Constant, Gigi Gin, MD [facility]    [place] Bing, MD

## 2021-10-16 LAB — RPR: RPR Ser Ql: NONREACTIVE

## 2021-10-16 LAB — HIV ANTIBODY (ROUTINE TESTING W REFLEX): HIV Screen 4th Generation wRfx: NONREACTIVE

## 2021-10-16 LAB — CBC
Hematocrit: 34.5 % (ref 34.0–46.6)
Hemoglobin: 11.6 g/dL (ref 11.1–15.9)
MCH: 32 pg (ref 26.6–33.0)
MCHC: 33.6 g/dL (ref 31.5–35.7)
MCV: 95 fL (ref 79–97)
Platelets: 254 10*3/uL (ref 150–450)
RBC: 3.63 x10E6/uL — ABNORMAL LOW (ref 3.77–5.28)
RDW: 11.5 % — ABNORMAL LOW (ref 11.7–15.4)
WBC: 10.5 10*3/uL (ref 3.4–10.8)

## 2021-10-16 LAB — GLUCOSE TOLERANCE, 2 HOURS W/ 1HR
Glucose, 1 hour: 145 mg/dL (ref 70–179)
Glucose, 2 hour: 108 mg/dL (ref 70–152)
Glucose, Fasting: 73 mg/dL (ref 70–91)

## 2021-10-27 ENCOUNTER — Ambulatory Visit (INDEPENDENT_AMBULATORY_CARE_PROVIDER_SITE_OTHER): Payer: Medicaid Other | Admitting: Obstetrics & Gynecology

## 2021-10-27 ENCOUNTER — Encounter: Payer: Self-pay | Admitting: Obstetrics & Gynecology

## 2021-10-27 VITALS — BP 113/76 | HR 96 | Wt 147.0 lb

## 2021-10-27 DIAGNOSIS — Z8759 Personal history of other complications of pregnancy, childbirth and the puerperium: Secondary | ICD-10-CM

## 2021-10-27 DIAGNOSIS — Z3A29 29 weeks gestation of pregnancy: Secondary | ICD-10-CM

## 2021-10-27 DIAGNOSIS — Z3483 Encounter for supervision of other normal pregnancy, third trimester: Secondary | ICD-10-CM

## 2021-10-27 DIAGNOSIS — Z3480 Encounter for supervision of other normal pregnancy, unspecified trimester: Secondary | ICD-10-CM

## 2021-10-27 NOTE — Progress Notes (Signed)
   PRENATAL VISIT NOTE  Subjective:  Maria Pugh is a 36 y.o. [phone] at [redacted]w[redacted]d being seen today for ongoing prenatal care.  She is currently monitored for the following issues for this low-risk pregnancy and has Anxiety and depression; Supervision of other normal pregnancy, antepartum; History of gestational hypertension; and H/O LEEP (loop electrosurgical excision procedure) of cervix complicating pregnancy, second trimester on their problem list.  Patient reports no complaints.  Contractions: Not present. Vag. Bleeding: None.  Movement: Present. Denies leaking of fluid.   The following portions of the patient's history were reviewed and updated as appropriate: allergies, current medications, past family history, past medical history, past social history, past surgical history and problem list.   Objective:   Vitals:   10/27/21 0823  BP: 113/76  Pulse: 96  Weight: 147 lb (66.7 kg)    Fetal Status: Fetal Heart Rate (bpm): 154 Fundal Height: 28 cm Movement: Present     General:  Alert, oriented and cooperative. Patient is in no acute distress.  Skin: Skin is warm and dry. No rash noted.   Cardiovascular: Normal heart rate noted  Respiratory: Normal respiratory effort, no problems with respiration noted  Abdomen: Soft, gravid, appropriate for gestational age.  Pain/Pressure: Present     Pelvic: Cervical exam deferred        Extremities: Normal range of motion.  Edema: Trace  Mental Status: Normal mood and affect. Normal behavior. Normal judgment and thought content.   . Assessment and Plan:  Pregnancy: [id number] at [redacted]w[redacted]d 1. History of gestational hypertension Stable BP, continue ASA  2. [redacted] weeks gestation of pregnancy 3. Supervision of other normal pregnancy, antepartum Normal 3rd trimester labs.  Has growth scan next month.  Considering BTL, papers signed today. Preterm labor symptoms and general obstetric precautions including but not limited to vaginal bleeding,  contractions, leaking of fluid and fetal movement were reviewed in detail with the patient. Please refer to After Visit Summary for other counseling recommendations.   Return in about 2 weeks (around 11/10/2021) for OFFICE OB VISIT (MD only).  Future Appointments  Date Time Provider Department Center  11/12/2021  8:15 AM Tereso Newcomer, MD [facility]  11/18/2021  9:15 AM [facility] NURSE [facility]  11/18/2021  9:30 AM [facility] US3 [facility]  11/24/2021 10:35 AM [place] Bing, MD [facility]  12/08/2021 10:35 AM Constant, Gigi Gin, MD [facility]  12/22/2021 10:35 AM [place] Stefanik, Jethro Bastos, MD [facility]  12/29/2021 10:35 AM [place] Delsol, Jethro Bastos, MD [facility]  01/05/2022 10:35 AM [place], Jethro Bastos, MD [facility]    Jaynie Collins, MD

## 2021-10-27 NOTE — Patient Instructions (Signed)
Return to office for any scheduled appointments. Call the office or go to the MAU at [facility] if: You begin to have strong, frequent contractions Your water breaks.  Sometimes it is a big gush of fluid, sometimes it is just a trickle that keeps getting your underwear wet or running down your legs You have vaginal bleeding.  It is normal to have a small amount of spotting if your cervix was checked.  You do not feel your baby moving like normal.  If you do not, get something to eat and drink and lay down and focus on feeling your baby move.   If your baby is still not moving like normal, you should call the office or go to MAU. Any other obstetric concerns.  

## 2021-11-12 ENCOUNTER — Encounter: Payer: Self-pay | Admitting: Obstetrics & Gynecology

## 2021-11-12 ENCOUNTER — Ambulatory Visit (INDEPENDENT_AMBULATORY_CARE_PROVIDER_SITE_OTHER): Payer: Medicaid Other | Admitting: Obstetrics & Gynecology

## 2021-11-12 VITALS — BP 120/80 | HR 101 | Wt 149.0 lb

## 2021-11-12 DIAGNOSIS — Z3480 Encounter for supervision of other normal pregnancy, unspecified trimester: Secondary | ICD-10-CM

## 2021-11-12 DIAGNOSIS — Z3483 Encounter for supervision of other normal pregnancy, third trimester: Secondary | ICD-10-CM

## 2021-11-12 DIAGNOSIS — Z3A31 31 weeks gestation of pregnancy: Secondary | ICD-10-CM

## 2021-11-12 NOTE — Progress Notes (Signed)
   PRENATAL VISIT NOTE  Subjective:  Maria Pugh is a 36 y.o. [phone] at [redacted]w[redacted]d being seen today for ongoing prenatal care.  She is currently monitored for the following issues for this low-risk pregnancy and has Anxiety and depression; Supervision of other normal pregnancy, antepartum; History of gestational hypertension; and H/O LEEP (loop electrosurgical excision procedure) of cervix complicating pregnancy, second trimester on their problem list.  Patient reports no complaints.  Contractions: Irritability. Vag. Bleeding: None.  Movement: Present. Denies leaking of fluid.   The following portions of the patient's history were reviewed and updated as appropriate: allergies, current medications, past family history, past medical history, past social history, past surgical history and problem list.   Objective:   Vitals:   11/12/21 0825  BP: 120/80  Pulse: (!) 101  Weight: 149 lb (67.6 kg)    Fetal Status: Fetal Heart Rate (bpm): 138 Fundal Height: 31 cm Movement: Present     General:  Alert, oriented and cooperative. Patient is in no acute distress.  Skin: Skin is warm and dry. No rash noted.   Cardiovascular: Normal heart rate noted  Respiratory: Normal respiratory effort, no problems with respiration noted  Abdomen: Soft, gravid, appropriate for gestational age.  Pain/Pressure: Present     Pelvic: Cervical exam deferred        Extremities: Normal range of motion.  Edema: Mild pitting, slight indentation  Mental Status: Normal mood and affect. Normal behavior. Normal judgment and thought content.   Assessment and Plan:  Pregnancy: [id number] at [redacted]w[redacted]d 1. [redacted] weeks gestation of pregnancy 2. Supervision of other normal pregnancy, antepartum Normal third trimester labs. History of preeclampsia in last pregnancy, BP checks and PEC precautions reviewed. Preterm labor symptoms and general obstetric precautions including but not limited to vaginal bleeding, contractions, leaking of fluid  and fetal movement were reviewed in detail with the patient. Please refer to After Visit Summary for other counseling recommendations.   Return in about 2 weeks (around 11/26/2021) for OFFICE OB VISIT (MD or APP).  Future Appointments  Date Time Provider [place]  11/18/2021  9:15 AM [facility] NURSE [facility]  11/18/2021  9:30 AM [facility] US3 [facility]  11/24/2021 10:35 AM Aletha Halim, MD [facility]  12/08/2021 10:35 AM Constant, Vickii Chafe, MD [facility]  12/22/2021 10:35 AM Calin Ellery, Sallyanne Havers, MD [facility]  12/29/2021 10:35 AM Terrin Meddaugh, Sallyanne Havers, MD [facility]  01/05/2022 10:35 AM Thos Matsumoto, Sallyanne Havers, MD [facility]    Verita Schneiders, MD

## 2021-11-12 NOTE — Patient Instructions (Signed)
Return to office for any scheduled appointments. Call the office or go to the MAU at [facility] [place] if: You begin to have strong, frequent contractions Your water breaks.  Sometimes it is a big gush of fluid, sometimes it is just a trickle that keeps getting your underwear wet or running down your legs You have vaginal bleeding.  It is normal to have a small amount of spotting if your cervix was checked.  You do not feel your baby moving like normal.  If you do not, get something to eat and drink and lay down and focus on feeling your baby move.   If your baby is still not moving like normal, you should call the office or go to MAU. Any other obstetric concerns.  

## 2021-11-18 ENCOUNTER — Ambulatory Visit: Payer: Medicaid Other | Attending: Obstetrics and Gynecology

## 2021-11-18 ENCOUNTER — Ambulatory Visit: Payer: Medicaid Other | Admitting: *Deleted

## 2021-11-18 VITALS — BP 113/75 | HR 95

## 2021-11-18 DIAGNOSIS — Z3689 Encounter for other specified antenatal screening: Secondary | ICD-10-CM | POA: Diagnosis not present

## 2021-11-18 DIAGNOSIS — O09299 Supervision of pregnancy with other poor reproductive or obstetric history, unspecified trimester: Secondary | ICD-10-CM | POA: Diagnosis not present

## 2021-11-18 DIAGNOSIS — Z3A32 32 weeks gestation of pregnancy: Secondary | ICD-10-CM | POA: Diagnosis not present

## 2021-11-18 DIAGNOSIS — O4443 Low lying placenta NOS or without hemorrhage, third trimester: Secondary | ICD-10-CM

## 2021-11-18 DIAGNOSIS — O09293 Supervision of pregnancy with other poor reproductive or obstetric history, third trimester: Secondary | ICD-10-CM

## 2021-11-24 ENCOUNTER — Encounter: Payer: Self-pay | Admitting: Obstetrics and Gynecology

## 2021-11-24 ENCOUNTER — Ambulatory Visit (INDEPENDENT_AMBULATORY_CARE_PROVIDER_SITE_OTHER): Payer: Medicaid Other | Admitting: Obstetrics and Gynecology

## 2021-11-24 VITALS — BP 112/78 | HR 94 | Wt 151.0 lb

## 2021-11-24 DIAGNOSIS — Z3A33 33 weeks gestation of pregnancy: Secondary | ICD-10-CM

## 2021-11-24 DIAGNOSIS — Z9889 Other specified postprocedural states: Secondary | ICD-10-CM

## 2021-11-24 DIAGNOSIS — Z3483 Encounter for supervision of other normal pregnancy, third trimester: Secondary | ICD-10-CM

## 2021-11-24 DIAGNOSIS — Z3480 Encounter for supervision of other normal pregnancy, unspecified trimester: Secondary | ICD-10-CM

## 2021-11-24 DIAGNOSIS — Z8759 Personal history of other complications of pregnancy, childbirth and the puerperium: Secondary | ICD-10-CM

## 2021-11-24 DIAGNOSIS — O3443 Maternal care for other abnormalities of cervix, third trimester: Secondary | ICD-10-CM

## 2021-11-24 NOTE — Progress Notes (Signed)
ROB [redacted]w[redacted]d  CC: pt had spotting with wiping last Sunday was pink none today.

## 2021-11-25 NOTE — Progress Notes (Signed)
   PRENATAL VISIT NOTE  Subjective:  Maria Pugh is a 36 y.o. [phone] at [redacted]w[redacted]d being seen today for ongoing prenatal care.  She is currently monitored for the following issues for this low-risk pregnancy and has Anxiety and depression; Supervision of other normal pregnancy, antepartum; History of gestational hypertension; and H/O LEEP (loop electrosurgical excision procedure) of cervix complicating pregnancy, second trimester on their problem list.  Patient reports  pink discharge last week, no pain .  Contractions: Irritability. Vag. Bleeding: None.  Movement: Present. Denies leaking of fluid.   The following portions of the patient's history were reviewed and updated as appropriate: allergies, current medications, past family history, past medical history, past social history, past surgical history and problem list.   Objective:   Vitals:   11/24/21 1057  BP: 112/78  Pulse: 94  Weight: 151 lb (68.5 kg)    Fetal Status: Fetal Heart Rate (bpm): 154 Fundal Height: 33 cm Movement: Present     General:  Alert, oriented and cooperative. Patient is in no acute distress.  Skin: Skin is warm and dry. No rash noted.   Cardiovascular: Normal heart rate noted  Respiratory: Normal respiratory effort, no problems with respiration noted  Abdomen: Soft, gravid, appropriate for gestational age.  Pain/Pressure: Present     Pelvic: Patient declines         Extremities: Normal range of motion.  Edema: Trace  Mental Status: Normal mood and affect. Normal behavior. Normal judgment and thought content.   Assessment and Plan:  Pregnancy: [id number] at [redacted]w[redacted]d 1. Supervision of other normal pregnancy, antepartum Routine care. MAU precautions given. 10/4 growth wnl, repeat prn.   2. H/O LEEP (loop electrosurgical excision procedure) of cervix complicating pregnancy, second trimester  3. History of gestational hypertension Continue low dose asa  Preterm labor symptoms and general obstetric precautions  including but not limited to vaginal bleeding, contractions, leaking of fluid and fetal movement were reviewed in detail with the patient. Please refer to After Visit Summary for other counseling recommendations.   No follow-ups on file.  Future Appointments  Date Time Provider [place]  12/08/2021 10:35 AM Constant, Vickii Chafe, MD [facility]  12/22/2021 10:35 AM Anyanwu, Sallyanne Havers, MD [facility]  12/29/2021 10:35 AM Anyanwu, Sallyanne Havers, MD [facility]  01/05/2022 10:35 AM Anyanwu, Sallyanne Havers, MD [facility]    Aletha Halim, MD

## 2021-12-08 ENCOUNTER — Encounter: Payer: Self-pay | Admitting: Obstetrics and Gynecology

## 2021-12-08 ENCOUNTER — Ambulatory Visit (INDEPENDENT_AMBULATORY_CARE_PROVIDER_SITE_OTHER): Payer: Medicaid Other | Admitting: Obstetrics and Gynecology

## 2021-12-08 VITALS — BP 117/80 | HR 106 | Wt 154.0 lb

## 2021-12-08 DIAGNOSIS — Z3483 Encounter for supervision of other normal pregnancy, third trimester: Secondary | ICD-10-CM

## 2021-12-08 DIAGNOSIS — Z3A35 35 weeks gestation of pregnancy: Secondary | ICD-10-CM

## 2021-12-08 DIAGNOSIS — Z3480 Encounter for supervision of other normal pregnancy, unspecified trimester: Secondary | ICD-10-CM

## 2021-12-08 DIAGNOSIS — Z8759 Personal history of other complications of pregnancy, childbirth and the puerperium: Secondary | ICD-10-CM

## 2021-12-08 NOTE — Progress Notes (Signed)
   PRENATAL VISIT NOTE  Subjective:  Maria Pugh is a 36 y.o. [phone] at [redacted]w[redacted]d being seen today for ongoing prenatal care.  She is currently monitored for the following issues for this low-risk pregnancy and has Anxiety and depression; Supervision of other normal pregnancy, antepartum; History of gestational hypertension; and H/O LEEP (loop electrosurgical excision procedure) of cervix complicating pregnancy, second trimester on their problem list.  Patient reports no complaints.  Contractions: Irritability. Vag. Bleeding: None.  Movement: Present. Denies leaking of fluid.   The following portions of the patient's history were reviewed and updated as appropriate: allergies, current medications, past family history, past medical history, past social history, past surgical history and problem list.   Objective:   Vitals:   12/08/21 1031  BP: 117/80  Pulse: (!) 106  Weight: 154 lb (69.9 kg)    Fetal Status: Fetal Heart Rate (bpm): 142 Fundal Height: 35 cm Movement: Present     General:  Alert, oriented and cooperative. Patient is in no acute distress.  Skin: Skin is warm and dry. No rash noted.   Cardiovascular: Normal heart rate noted  Respiratory: Normal respiratory effort, no problems with respiration noted  Abdomen: Soft, gravid, appropriate for gestational age.  Pain/Pressure: Present     Pelvic: Cervical exam deferred        Extremities: Normal range of motion.  Edema: Trace  Mental Status: Normal mood and affect. Normal behavior. Normal judgment and thought content.   Assessment and Plan:  Pregnancy: [id number] at [redacted]w[redacted]d 1. Supervision of other normal pregnancy, antepartum Patient is doing well without complaints Patient is very anxious about early deliver without GBS results Will plan to follow up in 1 week for vaginal cultures Patient is also interested in scheduling IOL around 39 weeks to avoid any staff issues during thanksgiving holiday. Patient has had that issue with  her first pregnancy with the christmas holiday Patient remain undecided on BTL for now  2. History of gestational hypertension Continue ASA  Preterm labor symptoms and general obstetric precautions including but not limited to vaginal bleeding, contractions, leaking of fluid and fetal movement were reviewed in detail with the patient. Please refer to After Visit Summary for other counseling recommendations.   Return in about 1 week (around 12/15/2021) for in person, ROB, Low risk, vaginal cultures.  Future Appointments  Date Time Provider [place]  12/22/2021 10:35 AM Anyanwu, Sallyanne Havers, MD [facility]  12/29/2021 10:35 AM Anyanwu, Sallyanne Havers, MD [facility]  01/05/2022 10:35 AM Anyanwu, Sallyanne Havers, MD [facility]    Mora Bellman, MD

## 2021-12-08 NOTE — Progress Notes (Signed)
ROB [redacted]w[redacted]d  Pt has concerns if she delivers before 36wks.

## 2021-12-15 ENCOUNTER — Ambulatory Visit (INDEPENDENT_AMBULATORY_CARE_PROVIDER_SITE_OTHER): Payer: Medicaid Other | Admitting: Obstetrics & Gynecology

## 2021-12-15 ENCOUNTER — Other Ambulatory Visit (HOSPITAL_COMMUNITY)
Admission: RE | Admit: 2021-12-15 | Discharge: 2021-12-15 | Disposition: A | Discharge status: Home / Self Care | Payer: Medicaid Other | Source: Ambulatory Visit | Attending: Advanced Practice Midwife | Admitting: Advanced Practice Midwife

## 2021-12-15 VITALS — BP 118/83 | HR 96 | Wt 156.0 lb

## 2021-12-15 DIAGNOSIS — Z3480 Encounter for supervision of other normal pregnancy, unspecified trimester: Secondary | ICD-10-CM

## 2021-12-15 DIAGNOSIS — Z3483 Encounter for supervision of other normal pregnancy, third trimester: Secondary | ICD-10-CM

## 2021-12-15 DIAGNOSIS — Z3A36 36 weeks gestation of pregnancy: Secondary | ICD-10-CM

## 2021-12-15 DIAGNOSIS — Z8759 Personal history of other complications of pregnancy, childbirth and the puerperium: Secondary | ICD-10-CM

## 2021-12-15 NOTE — Progress Notes (Signed)
   PRENATAL VISIT NOTE  Subjective:  Maria Pugh is a 36 y.o. [phone] at [redacted]w[redacted]d being seen today for ongoing prenatal care.  She is currently monitored for the following issues for this low-risk pregnancy and has Anxiety and depression; Supervision of other normal pregnancy, antepartum; History of gestational hypertension; and H/O LEEP (loop electrosurgical excision procedure) of cervix complicating pregnancy, second trimester on their problem list.  Patient reports no complaints.  Contractions: Irritability. Vag. Bleeding: None.  Movement: Present. Denies leaking of fluid.   The following portions of the patient's history were reviewed and updated as appropriate: allergies, current medications, past family history, past medical history, past social history, past surgical history and problem list.   Objective:   Vitals:   12/15/21 0945  BP: 118/83  Pulse: 96  Weight: 156 lb (70.8 kg)    Fetal Status: Fetal Heart Rate (bpm): 145 Fundal Height: 37 cm Movement: Present  Presentation: Vertex  General:  Alert, oriented and cooperative. Patient is in no acute distress.  Skin: Skin is warm and dry. No rash noted.   Cardiovascular: Normal heart rate noted  Respiratory: Normal respiratory effort, no problems with respiration noted  Abdomen: Soft, gravid, appropriate for gestational age.  Pain/Pressure: Present     Pelvic: Cervical exam performed in the presence of a chaperone Dilation: 1 Effacement (%): Thick Station: -3  Extremities: Normal range of motion.  Edema: Trace  Mental Status: Normal mood and affect. Normal behavior. Normal judgment and thought content.   Assessment and Plan:  Pregnancy: [id number] at [redacted]w[redacted]d 1. History of gestational hypertension Normal BP today, continue to monitor  2. [redacted] weeks gestation of pregnancy 3. Supervision of other normal pregnancy, antepartum Pelvic cultures done today, will follow up results and manage accordingly. - Cervicovaginal ancillary  only - Strep Gp B NAA Elective IOL at 39 weeks desired by patient, this was scheduled on 11/18 midnight. Orders signed and held.  Labor symptoms and general obstetric precautions including but not limited to vaginal bleeding, contractions, leaking of fluid and fetal movement were reviewed in detail with the patient. Please refer to After Visit Summary for other counseling recommendations.   Return in about 1 week (around 12/22/2021) for OFFICE OB VISIT (MD only).  Future Appointments  Date Time Provider [place]  12/22/2021 10:35 AM Jenniefer Salak, Sallyanne Havers, MD [facility]  12/29/2021 10:35 AM Jewelz Ricklefs, Sallyanne Havers, MD [facility]  01/05/2022 10:35 AM Vania Rosero, Sallyanne Havers, MD [facility]    Verita Schneiders, MD

## 2021-12-15 NOTE — Patient Instructions (Signed)
Return to office for any scheduled appointments. Call the office or go to the MAU at [facility] [place] if: You begin to have strong, frequent contractions Your water breaks.  Sometimes it is a big gush of fluid, sometimes it is just a trickle that keeps getting your underwear wet or running down your legs You have vaginal bleeding.  It is normal to have a small amount of spotting if your cervix was checked.  You do not feel your baby moving like normal.  If you do not, get something to eat and drink and lay down and focus on feeling your baby move.   If your baby is still not moving like normal, you should call the office or go to MAU. Any other obstetric concerns.  

## 2021-12-16 LAB — CERVICOVAGINAL ANCILLARY ONLY
Chlamydia: NEGATIVE
Comment: NEGATIVE
Comment: NORMAL
Neisseria Gonorrhea: NEGATIVE

## 2021-12-17 ENCOUNTER — Encounter: Payer: Medicaid Other | Admitting: Advanced Practice Midwife

## 2021-12-17 LAB — STREP GP B NAA: Strep Gp B NAA: NEGATIVE

## 2021-12-22 ENCOUNTER — Ambulatory Visit (INDEPENDENT_AMBULATORY_CARE_PROVIDER_SITE_OTHER): Payer: Medicaid Other | Admitting: Obstetrics & Gynecology

## 2021-12-22 VITALS — BP 115/72 | HR 94 | Wt 157.0 lb

## 2021-12-22 DIAGNOSIS — Z8759 Personal history of other complications of pregnancy, childbirth and the puerperium: Secondary | ICD-10-CM

## 2021-12-22 DIAGNOSIS — Z3A37 37 weeks gestation of pregnancy: Secondary | ICD-10-CM

## 2021-12-22 DIAGNOSIS — Z3480 Encounter for supervision of other normal pregnancy, unspecified trimester: Secondary | ICD-10-CM

## 2021-12-22 DIAGNOSIS — Z3483 Encounter for supervision of other normal pregnancy, third trimester: Secondary | ICD-10-CM

## 2021-12-22 NOTE — Patient Instructions (Signed)
Return to office for any scheduled appointments. Call the office or go to the MAU at [place] at [facility] if: You begin to have strong, frequent contractions Your water breaks.  Sometimes it is a big gush of fluid, sometimes it is just a trickle that keeps getting your underwear wet or running down your legs You have vaginal bleeding.  It is normal to have a small amount of spotting if your cervix was checked.  You do not feel your baby moving like normal.  If you do not, get something to eat and drink and lay down and focus on feeling your baby move.   If your baby is still not moving like normal, you should call the office or go to MAU. Any other obstetric concerns. ,

## 2021-12-22 NOTE — Progress Notes (Signed)
ROB [redacted]w[redacted]d  CC: None

## 2021-12-22 NOTE — Progress Notes (Signed)
   PRENATAL VISIT NOTE  Subjective:  Maria Pugh is a 36 y.o. [phone] at [redacted]w[redacted]d being seen today for ongoing prenatal care.  She is currently monitored for the following issues for this low-risk pregnancy and has Anxiety and depression; Supervision of other normal pregnancy, antepartum; History of gestational hypertension; and H/O LEEP (loop electrosurgical excision procedure) of cervix complicating pregnancy, second trimester on their problem list.  Patient reports no complaints.  Contractions: Irritability. Vag. Bleeding: None.  Movement: Present. Denies leaking of fluid.   The following portions of the patient's history were reviewed and updated as appropriate: allergies, current medications, past family history, past medical history, past social history, past surgical history and problem list.   Objective:   Vitals:   12/22/21 1055  BP: 115/72  Pulse: 94  Weight: 157 lb (71.2 kg)    Fetal Status: Fetal Heart Rate (bpm): 136   Movement: Present     General:  Alert, oriented and cooperative. Patient is in no acute distress.  Skin: Skin is warm and dry. No rash noted.   Cardiovascular: Normal heart rate noted  Respiratory: Normal respiratory effort, no problems with respiration noted  Abdomen: Soft, gravid, appropriate for gestational age.  Pain/Pressure: Present     Pelvic: Cervical exam deferred        Extremities: Normal range of motion.  Edema: None  Mental Status: Normal mood and affect. Normal behavior. Normal judgment and thought content.   Assessment and Plan:  Pregnancy: [id number] at [redacted]w[redacted]d 1. History of gestational hypertension Normal BP today, continue to monitor   2. [redacted] weeks gestation of pregnancy 3. Supervision of other normal pregnancy, antepartum Negative pelvic cultures. Term labor symptoms and general obstetric precautions including but not limited to vaginal bleeding, contractions, leaking of fluid and fetal movement were reviewed in detail with the  patient. Please refer to After Visit Summary for other counseling recommendations.   Return in about 1 week (around 12/29/2021) for OFFICE OB VISIT (MD only).  Future Appointments  Date Time Provider [place]  12/29/2021 10:35 AM Milayah Krell, Sallyanne Havers, MD [facility]  01/02/2022 12:00 AM [facility] SCHED ROOM [facility] None    Verita Schneiders, MD

## 2021-12-25 ENCOUNTER — Telehealth (HOSPITAL_COMMUNITY): Payer: Self-pay | Admitting: *Deleted

## 2021-12-25 ENCOUNTER — Encounter (HOSPITAL_COMMUNITY): Payer: Self-pay | Admitting: *Deleted

## 2021-12-25 NOTE — Telephone Encounter (Signed)
Preadmission screen  

## 2021-12-29 ENCOUNTER — Other Ambulatory Visit: Payer: Self-pay | Admitting: Advanced Practice Midwife

## 2021-12-29 ENCOUNTER — Ambulatory Visit (INDEPENDENT_AMBULATORY_CARE_PROVIDER_SITE_OTHER): Payer: Medicaid Other | Admitting: Obstetrics & Gynecology

## 2021-12-29 VITALS — BP 123/86 | HR 102 | Wt 159.0 lb

## 2021-12-29 DIAGNOSIS — Z3480 Encounter for supervision of other normal pregnancy, unspecified trimester: Secondary | ICD-10-CM

## 2021-12-29 DIAGNOSIS — Z8759 Personal history of other complications of pregnancy, childbirth and the puerperium: Secondary | ICD-10-CM

## 2021-12-29 DIAGNOSIS — Z3483 Encounter for supervision of other normal pregnancy, third trimester: Secondary | ICD-10-CM

## 2021-12-29 DIAGNOSIS — Z3A38 38 weeks gestation of pregnancy: Secondary | ICD-10-CM

## 2021-12-29 NOTE — Patient Instructions (Signed)
Return to office for any scheduled appointments. Call the office or go to the MAU at [facility] [place] if: You begin to have strong, frequent contractions Your water breaks.  Sometimes it is a big gush of fluid, sometimes it is just a trickle that keeps getting your underwear wet or running down your legs You have vaginal bleeding.  It is normal to have a small amount of spotting if your cervix was checked.  You do not feel your baby moving like normal.  If you do not, get something to eat and drink and lay down and focus on feeling your baby move.   If your baby is still not moving like normal, you should call the office or go to MAU. Any other obstetric concerns.  

## 2021-12-29 NOTE — Progress Notes (Signed)
   PRENATAL VISIT NOTE  Subjective:  Maria Pugh is a 36 y.o. [phone] at [redacted]w[redacted]d being seen today for ongoing prenatal care.  She is currently monitored for the following issues for this low-risk pregnancy and has Anxiety and depression; Supervision of other normal pregnancy, antepartum; History of gestational hypertension; and H/O LEEP (loop electrosurgical excision procedure) of cervix complicating pregnancy, second trimester on their problem list.  Patient reports no complaints.  Contractions: Irregular. Vag. Bleeding: None.  Movement: Present. Denies leaking of fluid.   The following portions of the patient's history were reviewed and updated as appropriate: allergies, current medications, past family history, past medical history, past social history, past surgical history and problem list.   Objective:   Vitals:   12/29/21 1032  BP: 123/86  Pulse: (!) 102  Weight: 159 lb (72.1 kg)    Fetal Status: Fetal Heart Rate (bpm): 148 Fundal Height: 39 cm Movement: Present     General:  Alert, oriented and cooperative. Patient is in no acute distress.  Skin: Skin is warm and dry. No rash noted.   Cardiovascular: Normal heart rate noted  Respiratory: Normal respiratory effort, no problems with respiration noted  Abdomen: Soft, gravid, appropriate for gestational age.  Pain/Pressure: Present     Pelvic: Cervical exam deferred        Extremities: Normal range of motion.  Edema: None  Mental Status: Normal mood and affect. Normal behavior. Normal judgment and thought content.   Assessment and Plan:  Pregnancy: [id number] at [redacted]w[redacted]d 1. History of gestational hypertension Stable BP  2. [redacted] weeks gestation of pregnancy 3. Supervision of other normal pregnancy, antepartum No issues. Scheduled for IOL 11/18. Labor symptoms and general obstetric precautions including but not limited to vaginal bleeding, contractions, leaking of fluid and fetal movement were reviewed in detail with the  patient. Please refer to After Visit Summary for other counseling recommendations.   Return for Postpartum check.  Future Appointments  Date Time Provider Department Center  01/02/2022 12:00 AM [facility] SCHED ROOM [facility] None    Jaynie Collins, MD

## 2022-01-02 ENCOUNTER — Inpatient Hospital Stay (HOSPITAL_COMMUNITY): Payer: Medicaid Other | Admitting: Anesthesiology

## 2022-01-02 ENCOUNTER — Encounter (HOSPITAL_COMMUNITY): Payer: Self-pay | Admitting: Obstetrics & Gynecology

## 2022-01-02 ENCOUNTER — Inpatient Hospital Stay (HOSPITAL_COMMUNITY): Payer: Medicaid Other

## 2022-01-02 ENCOUNTER — Inpatient Hospital Stay (HOSPITAL_COMMUNITY)
Admission: RE | Admit: 2022-01-02 | Discharge: 2022-01-03 | DRG: 807 | Disposition: A | Discharge status: Home / Self Care | Payer: Medicaid Other | Attending: Obstetrics & Gynecology | Admitting: Obstetrics & Gynecology

## 2022-01-02 DIAGNOSIS — O99214 Obesity complicating childbirth: Secondary | ICD-10-CM | POA: Diagnosis not present

## 2022-01-02 DIAGNOSIS — O9902 Anemia complicating childbirth: Secondary | ICD-10-CM | POA: Diagnosis not present

## 2022-01-02 DIAGNOSIS — O26893 Other specified pregnancy related conditions, third trimester: Secondary | ICD-10-CM | POA: Diagnosis not present

## 2022-01-02 DIAGNOSIS — Z8759 Personal history of other complications of pregnancy, childbirth and the puerperium: Secondary | ICD-10-CM

## 2022-01-02 DIAGNOSIS — Z9889 Other specified postprocedural states: Secondary | ICD-10-CM

## 2022-01-02 DIAGNOSIS — Z3A39 39 weeks gestation of pregnancy: Secondary | ICD-10-CM | POA: Diagnosis not present

## 2022-01-02 DIAGNOSIS — O3442 Maternal care for other abnormalities of cervix, second trimester: Secondary | ICD-10-CM

## 2022-01-02 DIAGNOSIS — Z3480 Encounter for supervision of other normal pregnancy, unspecified trimester: Secondary | ICD-10-CM

## 2022-01-02 LAB — CBC
HCT: 35.2 % — ABNORMAL LOW (ref 36.0–46.0)
Hemoglobin: 11.4 g/dL — ABNORMAL LOW (ref 12.0–15.0)
MCH: 29.8 pg (ref 26.0–34.0)
MCHC: 32.4 g/dL (ref 30.0–36.0)
MCV: 91.9 fL (ref 80.0–100.0)
Platelets: 280 10*3/uL (ref 150–400)
RBC: 3.83 MIL/uL — ABNORMAL LOW (ref 3.87–5.11)
RDW: 13.2 % (ref 11.5–15.5)
WBC: 12.2 10*3/uL — ABNORMAL HIGH (ref 4.0–10.5)
nRBC: 0 % (ref 0.0–0.2)

## 2022-01-02 LAB — TYPE AND SCREEN
ABO/RH(D): B POS
Antibody Screen: NEGATIVE

## 2022-01-02 LAB — RPR: RPR Ser Ql: NONREACTIVE

## 2022-01-02 MED ORDER — LACTATED RINGERS IV SOLN: INTRAVENOUS | Status: DC

## 2022-01-02 MED ORDER — PRENATAL MULTIVITAMIN CH
1.0000 | ORAL_TABLET | Freq: Every day | ORAL | Status: DC
  Filled 2022-01-02: qty 1

## 2022-01-02 MED ORDER — OXYTOCIN-SODIUM CHLORIDE 30-0.9 UT/500ML-% IV SOLN
1.0000 m[IU]/min | INTRAVENOUS | Status: DC
  Administered 2022-01-02: 2 m[IU]/min via INTRAVENOUS
  Filled 2022-01-02: qty 500

## 2022-01-02 MED ORDER — FENTANYL-BUPIVACAINE-NACL 0.5-0.125-0.9 MG/250ML-% EP SOLN
12.0000 mL/h | EPIDURAL | Status: DC | PRN
  Administered 2022-01-02: 12 mL/h via EPIDURAL
  Filled 2022-01-02: qty 250

## 2022-01-02 MED ORDER — ONDANSETRON HCL 4 MG PO TABS: 4.0000 mg | ORAL_TABLET | ORAL | Status: DC | PRN

## 2022-01-02 MED ORDER — WITCH HAZEL-GLYCERIN EX PADS: 1.0000 | MEDICATED_PAD | CUTANEOUS | Status: DC | PRN

## 2022-01-02 MED ORDER — OXYCODONE-ACETAMINOPHEN 5-325 MG PO TABS: 2.0000 | ORAL_TABLET | ORAL | Status: DC | PRN

## 2022-01-02 MED ORDER — ACETAMINOPHEN 325 MG PO TABS: 650.0000 mg | ORAL_TABLET | ORAL | Status: DC | PRN

## 2022-01-02 MED ORDER — TETANUS-DIPHTH-ACELL PERTUSSIS 5-2.5-18.5 LF-MCG/0.5 IM SUSY: 0.5000 mL | PREFILLED_SYRINGE | Freq: Once | INTRAMUSCULAR | Status: DC

## 2022-01-02 MED ORDER — LACTATED RINGERS IV SOLN: 500.0000 mL | Freq: Once | INTRAVENOUS | Status: DC

## 2022-01-02 MED ORDER — SOD CITRATE-CITRIC ACID 500-334 MG/5ML PO SOLN: 30.0000 mL | ORAL | Status: DC | PRN

## 2022-01-02 MED ORDER — IBUPROFEN 600 MG PO TABS
600.0000 mg | ORAL_TABLET | Freq: Four times a day (QID) | ORAL | Status: DC
  Administered 2022-01-02 – 2022-01-03 (×3): 600 mg via ORAL
  Filled 2022-01-02 (×4): qty 1

## 2022-01-02 MED ORDER — HYDROXYZINE HCL 50 MG PO TABS: 50.0000 mg | ORAL_TABLET | Freq: Four times a day (QID) | ORAL | Status: DC | PRN

## 2022-01-02 MED ORDER — PHENYLEPHRINE 80 MCG/ML (10ML) SYRINGE FOR IV PUSH (FOR BLOOD PRESSURE SUPPORT): 80.0000 ug | PREFILLED_SYRINGE | INTRAVENOUS | Status: DC | PRN

## 2022-01-02 MED ORDER — OXYTOCIN-SODIUM CHLORIDE 30-0.9 UT/500ML-% IV SOLN: 2.5000 [IU]/h | INTRAVENOUS | Status: DC

## 2022-01-02 MED ORDER — TRANEXAMIC ACID-NACL 1000-0.7 MG/100ML-% IV SOLN: 1000.0000 mg | INTRAVENOUS | Status: AC

## 2022-01-02 MED ORDER — COCONUT OIL OIL: 1.0000 | TOPICAL_OIL | Status: DC | PRN

## 2022-01-02 MED ORDER — EPHEDRINE 5 MG/ML INJ: 10.0000 mg | INTRAVENOUS | Status: DC | PRN

## 2022-01-02 MED ORDER — SENNOSIDES-DOCUSATE SODIUM 8.6-50 MG PO TABS
2.0000 | ORAL_TABLET | ORAL | Status: DC
  Filled 2022-01-02: qty 2

## 2022-01-02 MED ORDER — OXYCODONE-ACETAMINOPHEN 5-325 MG PO TABS: 1.0000 | ORAL_TABLET | ORAL | Status: DC | PRN

## 2022-01-02 MED ORDER — SIMETHICONE 80 MG PO CHEW: 80.0000 mg | CHEWABLE_TABLET | ORAL | Status: DC | PRN

## 2022-01-02 MED ORDER — SODIUM CHLORIDE 0.9% FLUSH: 3.0000 mL | Freq: Two times a day (BID) | INTRAVENOUS | Status: DC

## 2022-01-02 MED ORDER — SODIUM CHLORIDE 0.9% FLUSH: 3.0000 mL | INTRAVENOUS | Status: DC | PRN

## 2022-01-02 MED ORDER — TRANEXAMIC ACID-NACL 1000-0.7 MG/100ML-% IV SOLN
INTRAVENOUS | Status: AC
  Administered 2022-01-02: 1000 mg via INTRAVENOUS
  Filled 2022-01-02: qty 100

## 2022-01-02 MED ORDER — LIDOCAINE HCL (PF) 1 % IJ SOLN
INTRAMUSCULAR | Status: DC | PRN
  Administered 2022-01-02: 11 mL via EPIDURAL

## 2022-01-02 MED ORDER — LIDOCAINE HCL (PF) 1 % IJ SOLN: 30.0000 mL | INTRAMUSCULAR | Status: DC | PRN

## 2022-01-02 MED ORDER — DIPHENHYDRAMINE HCL 25 MG PO CAPS: 25.0000 mg | ORAL_CAPSULE | Freq: Four times a day (QID) | ORAL | Status: DC | PRN

## 2022-01-02 MED ORDER — ONDANSETRON HCL 4 MG/2ML IJ SOLN: 4.0000 mg | Freq: Four times a day (QID) | INTRAMUSCULAR | Status: DC | PRN

## 2022-01-02 MED ORDER — ONDANSETRON HCL 4 MG/2ML IJ SOLN: 4.0000 mg | INTRAMUSCULAR | Status: DC | PRN

## 2022-01-02 MED ORDER — OXYTOCIN BOLUS FROM INFUSION
333.0000 mL | Freq: Once | INTRAVENOUS | Status: AC
  Administered 2022-01-02: 333 mL via INTRAVENOUS

## 2022-01-02 MED ORDER — SODIUM CHLORIDE 0.9 % IV SOLN: INTRAVENOUS | Status: DC | PRN

## 2022-01-02 MED ORDER — BENZOCAINE-MENTHOL 20-0.5 % EX AERO: 1.0000 | INHALATION_SPRAY | CUTANEOUS | Status: DC | PRN

## 2022-01-02 MED ORDER — ZOLPIDEM TARTRATE 5 MG PO TABS: 5.0000 mg | ORAL_TABLET | Freq: Every evening | ORAL | Status: DC | PRN

## 2022-01-02 MED ORDER — FLEET ENEMA 7-19 GM/118ML RE ENEM: 1.0000 | ENEMA | Freq: Every day | RECTAL | Status: DC | PRN

## 2022-01-02 MED ORDER — DIBUCAINE (PERIANAL) 1 % EX OINT: 1.0000 | TOPICAL_OINTMENT | CUTANEOUS | Status: DC | PRN

## 2022-01-02 MED ORDER — TERBUTALINE SULFATE 1 MG/ML IJ SOLN: 0.2500 mg | Freq: Once | INTRAMUSCULAR | Status: DC | PRN

## 2022-01-02 MED ORDER — DIPHENHYDRAMINE HCL 50 MG/ML IJ SOLN: 12.5000 mg | INTRAMUSCULAR | Status: DC | PRN

## 2022-01-02 MED ORDER — BUPIVACAINE HCL (PF) 0.25 % IJ SOLN
INTRAMUSCULAR | Status: DC | PRN
  Administered 2022-01-02: 10 mL via EPIDURAL

## 2022-01-02 MED ORDER — LACTATED RINGERS IV SOLN: 500.0000 mL | INTRAVENOUS | Status: DC | PRN

## 2022-01-02 MED ORDER — FENTANYL CITRATE (PF) 100 MCG/2ML IJ SOLN: 50.0000 ug | INTRAMUSCULAR | Status: DC | PRN

## 2022-01-02 NOTE — Discharge Summary (Signed)
Postpartum Discharge Summary  Date of Service updated***     Patient Name: Maria Pugh DOB: [birth year] MRN: [id number]  Date of admission: 01/02/2022 Delivery date:01/02/2022  Delivering provider: Stormy Card  Date of discharge: 01/02/2022  Admitting diagnosis: Indication for care in labor or delivery [O75.9] Intrauterine pregnancy: [redacted]w[redacted]d    Secondary diagnosis:  Principal Problem:   Indication for care in labor or delivery Active Problems:   Supervision of other normal pregnancy, antepartum   History of gestational hypertension   H/O LEEP (loop electrosurgical excision procedure) of cervix complicating pregnancy, second trimester   SVD (spontaneous vaginal delivery)  Additional problems: ***    Discharge diagnosis: Term Pregnancy Delivered                                              Post partum procedures:{Postpartum procedures:23558} Augmentation: Pitocin Complications: None  Hospital course: Induction of Labor With Vaginal Delivery   36y.o. yo [id number] 34w0das admitted to the hospital 01/02/2022 for induction of labor.  Indication for induction: Elective.  Patient had an labor course was uncomplicated. Membrane Rupture Time/Date: 10:21 AM ,01/02/2022   Delivery Method:Vaginal, Spontaneous  Episiotomy: None  Lacerations:  Periurethral  Details of delivery can be found in separate delivery note.  Patient had a postpartum course complicated by***. Patient is discharged home 01/02/22.  Newborn Data: Birth date:01/02/2022  Birth time:10:49 AM  Gender:Female  Living status:Living  Apgars: ,  Weight:   Magnesium Sulfate received: No BMZ received: No Rhophylac:N/A MMR:N/A, Rubella Immune T-DaP:{Tdap:23962} Flu: {F{DGL:87564}ransfusion:{Transfusion received:30440034}  Physical exam  Vitals:   01/02/22 1032 01/02/22 1036 01/02/22 1041 01/02/22 1051  BP: 127/74 (!) 140/85 118/85 133/86  Pulse: (!) 114 (!) 115 (!) 121 (!) 132  Resp:      Temp:       TempSrc:      SpO2:      Weight:      Height:       General: {Exam; general:21111117} Lochia: {Desc; appropriate/inappropriate:30686::"appropriate"} Uterine Fundus: {Desc; firm/soft:30687} Incision: {Exam; incision:21111123} DVT Evaluation: {Exam; dvPPI:9518841}abs: Lab Results  Component Value Date   WBC 12.2 (H) 01/02/2022   HGB 11.4 (L) 01/02/2022   HCT 35.2 (L) 01/02/2022   MCV 91.9 01/02/2022   PLT 280 01/02/2022      Latest Ref Rng & Units 06/25/2021    3:43 PM  CMP  Glucose 70 - 99 mg/dL 81   BUN 6 - 20 mg/dL 7   Creatinine 0.57 - 1.00 mg/dL 0.50   Sodium 134 - 144 mmol/L 137   Potassium 3.5 - 5.2 mmol/L 3.6   Chloride 96 - 106 mmol/L 100   CO2 20 - 29 mmol/L 22   Calcium 8.7 - 10.2 mg/dL 9.2   Total Protein 6.0 - 8.5 g/dL 6.2   Total Bilirubin 0.0 - 1.2 mg/dL <0.2   Alkaline Phos 44 - 121 IU/L 51   AST 0 - 40 IU/L 16   ALT 0 - 32 IU/L 16    Edinburgh Score:     No data to display           After visit meds:  Allergies as of 01/02/2022   No Known Allergies   Med [facility] completed [facility]        Discharge home in stable  condition Infant Feeding: {Baby feeding:23562} Infant Disposition:{CHL IP OB HOME WITH MEQAST:41962} Discharge instruction: per After Visit Summary and Postpartum booklet. Activity: Advance as tolerated. Pelvic rest for 6 weeks.  Diet: {OB IWLN:98921194} Future Appointments:No future appointments. Follow up Visit:  The following message was sent to [facility] by Mikki Santee, MD  Please schedule this patient for a In person postpartum visit in 4 weeks with the following provider: Any provider. Additional Postpartum F/U: none   Low risk pregnancy complicated by:  none Delivery mode:  Vaginal, Spontaneous  Anticipated Birth Control:  POPs   01/02/2022 Chiagoziem Sherrilyn Rist, MD

## 2022-01-02 NOTE — Progress Notes (Signed)
MOB was referred for history of depression/anxiety. * Referral screened out by Clinical Social Worker because none of the following criteria appear to apply: ~ History of anxiety/depression during this pregnancy, or of post-partum depression following prior delivery. ~ Diagnosis of anxiety and/or depression within last 3 years MOB's most recent diagnosis was 01/2017. No mental health concerns noted in MOB's pnc records during this pregnancy.  OR * MOB's symptoms currently being treated with medication and/or therapy. Please contact the Clinical Social Worker if needs arise, by MOB request, or if MOB scores greater than 9/yes to question 10 on Edinburgh Postpartum Depression Screen.  Signed,  Ozzy Bohlken K. Yzabella Crunk, MSW, LCSWA, LCASA 01/02/2022 5:43 PM 

## 2022-01-02 NOTE — H&P (Cosign Needed Addendum)
OBSTETRIC ADMISSION HISTORY AND PHYSICAL  Maria Pugh is a 36 y.o. female [phone] with IUP at 37w0dby 8W [place] She reports +FMs, No LOF, no VB, no blurry vision, headaches or peripheral edema, and RUQ pain.  She plans on breast feeding. She request POPs for birth control. [facility] [place]   Dating: By 8W [place]--->  Estimated Date of Delivery: 01/09/22  Sono:   _0 , measuring 341w5dnormal anatomy, cephalic presentation, anterior placenta, 1869g, 22% EFW  Prenatal History/Complications:  -H/o GHTN -Low laying placenta, resolved    Past Medical History: Past Medical History:  Diagnosis Date   Abnormal uterine bleeding (AUB) 11/15/2016   Anxiety    Depression    Discharge from left nipple 04/11/2019   Galactorrhea    Gestational hypertension    History of abnormal cervical Pap smear 08/09/2014   LEEP. Follow-up Paps normal.   Pancreatitis    Shingles 2013 and 2014   Vaginal Pap smear, abnormal     Past Surgical History: Past Surgical History:  Procedure Laterality Date   LEEP     MOUTH SURGERY  07/2014    Obstetrical History: OB History     Gravida  4   Para  2   Term  2   Preterm  0   AB  1   Living  2      SAB  1   IAB  0   Ectopic  0   Multiple  0   Live Births  2        Obstetric Comments  1st Menstrual Cycle:  12  1st Pregnancy:  [age]        Social History Social History   Socioeconomic History   Marital status: Divorced    Spouse name: Not on file   Number of children: 2   Years of education: 12   Highest education level: Bachelor's degree (e.g., BA, AB, BS)  Occupational History   Occupation: haProduct managerTobacco Use   Smoking status: Never   Smokeless tobacco: Never  Vaping Use   Vaping Use: Never used  Substance and Sexual Activity   Alcohol use: No   Drug use: No   Sexual activity: Yes    Partners: Male  Other Topics Concern   Not on file  Social History  Narrative   Not on file   Social Determinants of Health   Financial Resource Strain: Not on file  Food Insecurity: Not on file  Transportation Needs: Not on file  Physical Activity: Not on file  Stress: Not on file  Social Connections: Not on file    Family History: Family History  Problem Relation Age of Onset   Mental illness Mother    Thyroid disease Mother    GrBerenice Primasdisease Mother    Bipolar disorder Mother    Heart disease Father    Hypertension Father    Stroke Father    Clotting disorder Father    Heart attack Father    Birth defects Brother    Autism Brother    Hashimoto's thyroiditis Brother    Developmental delay Daughter    Cancer Maternal Grandmother    Cancer Maternal Grandfather    Lung cancer Maternal Grandfather    Cancer Paternal Grandmother    Diabetes Paternal Grandmother    Pancreatic cancer Paternal Grandmother    Cancer Paternal Grandfather    Lung disease Paternal Grandfather  Kidney disease Paternal Grandfather    Acromegaly Cousin    Breast cancer Neg Hx    Asthma Neg Hx     Allergies: No Known Allergies  Medications Prior to Admission  Medication Sig Dispense Refill Last Dose   aspirin EC 81 MG tablet Take 1 tablet (81 mg total) by mouth daily. Take after 12 weeks for prevention of preeclampsia later in pregnancy 300 tablet 2    Blood Pressure KIT 1 Device by Does not apply route once a week. 1 kit 0    Magnesium Oxide (MAGNESIUM OXIDE 400) 240 MG PACK Take 1 tablet by mouth daily as needed (headache). 30 each 0    Pediatric Multivit-Minerals-C (FLINTSTONES GUMMIES PO) Take 2 each by mouth at bedtime.       Review of Systems   All systems reviewed and negative except as stated in HPI  There were no vitals taken for this visit. General appearance: alert, cooperative, and no distress Lungs: clear to auscultation bilaterally Heart: regular rate and rhythm Abdomen: soft, non-tender; bowel sounds normal Pelvic:  adequate Extremities: Homans sign is negative, no sign of DVT Presentation: cephalic Fetal monitoringBaseline: 140 bpm, Variability: moderate, Accelerations: present, and Decelerations: none Uterine activity: irregular irritability    Prenatal labs: ABO, Rh: B/Positive/-- (05/11 1544) Antibody: Negative (05/11 1544) Rubella: 4.34 (05/11 1544) RPR: Non Reactive (08/31 0839)  HBsAg: Negative (05/11 1544)  HIV: Non Reactive (08/31 0839)  GBS: Negative/-- (10/31 1042)  1 hr Glucola neg Genetic screening  wnl Anatomy US normal  Prenatal Transfer Tool  Maternal Diabetes: No Genetic Screening: Normal Maternal Ultrasounds/Referrals: Normal Fetal Ultrasounds or other Referrals:  None Maternal Substance Abuse:  No Significant Maternal Medications:  None Significant Maternal Lab Results:  Group B Strep negative Number of Prenatal Visits:greater than 3 verified prenatal visits Other Comments:  None  No results found for this or any previous visit (from the past 24 hour(s)).  Patient Active Problem List   Diagnosis Date Noted   Indication for care in labor or delivery 01/02/2022   H/O LEEP (loop electrosurgical excision procedure) of cervix complicating pregnancy, second trimester 07/23/2021   Supervision of other normal pregnancy, antepartum 06/25/2021   History of gestational hypertension 06/25/2021   Anxiety and depression 01/31/2017    Assessment/Plan:  Maria Pugh is a 36 y.o. [id number] at 64w0dhere for eIOL.   #IOL: SVE 3/50/-1 and soft. Plan to start Pitocin. AROM when able.  #Pain: Maternally supported for now, considering epidural  #FWB: Cat I  #ID:  GBS neg #MOF: Breast #MOC: POPs #Circ:  Yes  DRebecca Eaton MD  PGY3 Family Medicine Resident MAuberry11/18/2023, 12:40 AM   Fellow Attestation  I saw and evaluated the patient, performing the key elements of the service.I  personally performed or re-performed the history, physical exam, and medical  decision making activities of this service and have verified that the service and findings are accurately documented in the resident's note. I developed the management plan that is described in the resident's note, and I agree with the content, with my edits above.    VGifford Shave MD OB Fellow

## 2022-01-02 NOTE — Anesthesia Preprocedure Evaluation (Signed)
Anesthesia Evaluation  Patient identified by MRN, date of birth, ID band Patient awake    Reviewed: Allergy & Precautions, NPO status , Patient's Chart, lab work & pertinent test results  History of Anesthesia Complications Negative for: history of anesthetic complications  Airway Mallampati: II  TM Distance: >3 FB Neck ROM: Full    Dental no notable dental hx. (+) Dental Advisory Given   Pulmonary neg pulmonary ROS   Pulmonary exam normal breath sounds clear to auscultation       Cardiovascular hypertension, negative cardio ROS Normal cardiovascular exam Rhythm:Regular Rate:Normal     Neuro/Psych negative neurological ROS  negative psych ROS   GI/Hepatic negative GI ROS, Neg liver ROS,,,  Endo/Other  obesity  Renal/GU negative Renal ROS  negative genitourinary   Musculoskeletal negative musculoskeletal ROS (+)    Abdominal   Peds negative pediatric ROS (+)  Hematology negative hematology ROS (+)   Anesthesia Other Findings   Reproductive/Obstetrics (+) Pregnancy                              Anesthesia Physical Anesthesia Plan  ASA: II  Anesthesia Plan: Epidural   Post-op Pain Management:    Induction:   PONV Risk Score and Plan:   Airway Management Planned:   Additional Equipment:   Intra-op Plan:   Post-operative Plan:   Informed Consent: I have reviewed the patients History and Physical, chart, labs and discussed the procedure including the risks, benefits and alternatives for the proposed anesthesia with the patient or authorized representative who has indicated his/her understanding and acceptance.     Dental advisory given  Plan Discussed with: CRNA  Anesthesia Plan Comments:          Anesthesia Quick Evaluation

## 2022-01-02 NOTE — Anesthesia Procedure Notes (Signed)
Epidural Patient location during procedure: OB Start time: 01/02/2022 7:28 AM End time: 01/02/2022 7:56 AM  Staffing Anesthesiologist: Lowella Curb, MD Performed: anesthesiologist   Preanesthetic Checklist Completed: patient identified, IV checked, site marked, risks and benefits discussed, surgical consent, monitors and equipment checked, pre-op evaluation and timeout performed  Epidural Patient position: sitting Prep: ChloraPrep Patient monitoring: heart rate, cardiac monitor, continuous pulse ox and blood pressure Approach: midline Location: L2-L3 Injection technique: LOR saline  Needle:  Needle type: Tuohy  Needle gauge: 17 G Needle length: 9 cm Needle insertion depth: 8 cm Catheter type: closed end flexible Catheter size: 20 Guage Catheter at skin depth: 12 cm Test dose: negative  Assessment Events: blood not aspirated, injection not painful, no injection resistance, no paresthesia and negative IV test  Additional Notes Reason for block:procedure for pain

## 2022-01-02 NOTE — Lactation Note (Signed)
This note was copied from a baby's chart. Lactation Consultation Note  Patient Name: Maria Pugh INOMV'E Date: 01/02/2022 Reason for consult: Follow-up assessment Age:34 hours  P3, Baby just finished 30 min feeding. Feed on demand with cues.  Goal 8-12+ times per day after first 24 hrs.  Place baby STS if not cueing. ' Mom made aware of O/P services, breastfeeding support groups, community resources, and our phone # for post-discharge questions.  Mother will call if she needs assistance.   Maternal Data Has patient been taught Hand Expression?: Yes Does the patient have breastfeeding experience prior to this delivery?: Yes How long did the patient breastfeed?: 6 mos with 2nd child  Feeding Mother's Current Feeding Choice: Breast Milk  LATCH Score Latch: Grasps breast easily, tongue down, lips flanged, rhythmical sucking.  Audible Swallowing: A few with stimulation  Type of Nipple: Everted at rest and after stimulation  Comfort (Breast/Nipple): Soft / non-tender  Hold (Positioning): No assistance needed to correctly position infant at breast.  LATCH Score: 9   Lactation Tools Discussed/Used    Interventions Interventions: Education;LC Services brochure  Discharge    Consult Status Consult Status: Follow-up Date: 01/03/22 Follow-up type: In-patient    Dahlia Byes [facility] 01/02/2022, 1:40 PM

## 2022-01-02 NOTE — Lactation Note (Signed)
This note was copied from a baby's chart. Lactation Consultation Note  Patient Name: Maria Pugh MEQAS'T Date: 01/02/2022 Reason for consult: Term Age:34 hours Per Birth Parent infant is latching well at the breast. Birth Parent latched infant on her left breast using the cradle hold position, LC did not help with latch, infant sustained latch and was still BF when LC left the room. Birth Parent will continue to BF infant by cues, on demand, 8 to 12+ times within 24 hours, STS.  Birth Parent knows to call RN/LC if their are any BF questions, concerns or needs latch assistance.  Maternal Data    Feeding Mother's Current Feeding Choice: Breast Milk and Formula  LATCH Score Latch: Grasps breast easily, tongue down, lips flanged, rhythmical sucking.  Audible Swallowing: Spontaneous and intermittent  Type of Nipple: Everted at rest and after stimulation  Comfort (Breast/Nipple): Soft / non-tender  Hold (Positioning): No assistance needed to correctly position infant at breast.  LATCH Score: 10   Lactation Tools Discussed/Used    Interventions Interventions: Position options;Breast compression;Education  Discharge    Consult Status Consult Status: Follow-up Date: 01/03/22 Follow-up type: In-patient    Frederico Hamman 01/02/2022, 8:16 PM

## 2022-01-03 MED ORDER — SLYND 4 MG PO TABS: 1.0000 | ORAL_TABLET | Freq: Every day | ORAL | 0 refills | Status: DC

## 2022-01-03 MED ORDER — IBUPROFEN 600 MG PO TABS: 600.0000 mg | ORAL_TABLET | Freq: Four times a day (QID) | ORAL | 0 refills | Status: DC

## 2022-01-03 MED ORDER — ACETAMINOPHEN 325 MG PO TABS: 650.0000 mg | ORAL_TABLET | ORAL | 0 refills | Status: DC | PRN

## 2022-01-03 NOTE — Lactation Note (Signed)
This note was copied from a baby's chart. Lactation Consultation Note  Patient Name: Maria Pugh ZMCEY'E Date: 01/03/2022 Reason for consult: Follow-up assessment Age:34 hours  P3, Baby recently breastfed for 10 min and is now sleeping. Feed on demand with cues.  Goal 8-12+ times per day after first 24 hrs.  Reviewed engorgement care and monitoring voids/stools.  Feeding Mother's Current Feeding Choice: Breast Milk  Interventions Interventions: Education  Discharge Discharge Education: Engorgement and breast care;Warning signs for feeding baby  Consult Status Consult Status: Complete Date: 01/03/22    Dahlia Byes [facility] 01/03/2022, 12:06 PM

## 2022-01-03 NOTE — Anesthesia Postprocedure Evaluation (Signed)
Anesthesia Post Note  Patient: Maria Pugh  Procedure(s) Performed: AN AD HOC LABOR EPIDURAL     Patient location during evaluation: Mother Baby Anesthesia Type: Epidural Level of consciousness: awake, awake and alert and oriented Pain management: pain level controlled Vital Signs Assessment: post-procedure vital signs reviewed and stable Respiratory status: spontaneous breathing, respiratory function stable and nonlabored ventilation Cardiovascular status: blood pressure returned to baseline and stable Postop Assessment: no headache, no backache, patient able to bend at knees, able to ambulate and adequate PO intake Anesthetic complications: no   No notable events documented.  Last Vitals:  Vitals:   01/03/22 0007 01/03/22 0511  BP: 101/75 (!) 88/63  Pulse: (!) 104 91  Resp: 20 18  Temp: 36.7 C 36.7 C  SpO2: 99% 100%    Last Pain:  Vitals:   01/03/22 0511  TempSrc: Oral  PainSc: 4    Pain Goal: Patients Stated Pain Goal: 2 (01/03/22 0511)                 [name]

## 2022-01-05 ENCOUNTER — Encounter: Payer: Medicaid Other | Admitting: Obstetrics & Gynecology

## 2022-01-12 ENCOUNTER — Telehealth (HOSPITAL_COMMUNITY): Payer: Self-pay | Admitting: *Deleted

## 2022-01-12 NOTE — Telephone Encounter (Signed)
Mom reports feeling good. No concerns about herself at this time. EPDS=0 [facility] score=0) Mom reports baby is doing well. Feeding, peeing, and pooping without difficulty. Safe sleep reviewed. Mom reports no concerns about baby at present.  Duffy Rhody, RN 01-12-2022 at 10:01am

## 2022-01-25 ENCOUNTER — Encounter: Payer: Medicaid Other | Admitting: Family Medicine

## 2022-01-25 ENCOUNTER — Other Ambulatory Visit: Payer: Self-pay | Admitting: *Deleted

## 2022-01-25 MED ORDER — SLYND 4 MG PO TABS: 1.0000 | ORAL_TABLET | Freq: Every day | ORAL | 3 refills | Status: DC

## 2022-01-25 NOTE — Progress Notes (Deleted)
    Post Partum Visit Note  Maria Pugh is a 36 y.o. [phone] female who presents for a postpartum visit. She is {1-10:13787} {time; units:18646} postpartum following a {method of delivery:313099}.  I have fully reviewed the prenatal and intrapartum course. The delivery was at *** gestational weeks.  Anesthesia: {anesthesia types:812}. Postpartum course has been ***. Baby is doing well***. Baby is feeding by {breastmilk/bottle:69}. Bleeding {vag bleed:12292}. Bowel function is {normal:32111}. Bladder function is {normal:32111}. Patient {is/is not:9024} sexually active. Contraception method is {contraceptive method:5051}. Postpartum depression screening: {gen negative/positive:315881}.   The pregnancy intention screening data noted above was reviewed. Potential methods of contraception were discussed. The patient elected to proceed with No data recorded.    There are no preventive care reminders to display for this patient.  {Common ambulatory SmartLinks:19316}  Review of Systems {ros; complete:30496}  Objective:  There were no vitals taken for this visit.   General:  {gen appearance:16600}   Breasts:  {desc; normal/abnormal/not indicated:14647}  Lungs: {lung exam:16931}  Heart:  {heart exam:5510}  Abdomen: {abdomen exam:16834}   Wound {Wound assessment:11097}  GU exam:  {desc; normal/abnormal/not indicated:14647}       Assessment:    There are no diagnoses linked to this encounter.  *** postpartum exam.   Plan:   Essential components of care per ACOG recommendations:  1.  Mood and well being: Patient with {gen negative/positive:315881} depression screening today. Reviewed local resources for support.  - Patient tobacco use? {tobacco use:25506}  - hx of drug use? {yes/no:25505}    2. Infant care and feeding:  -Patient currently breastmilk feeding? {yes/no:25502}  -Social determinants of health (SDOH) reviewed in EPIC. No concerns***The following needs were  identified***  3. Sexuality, contraception and birth spacing - Patient {DOES_DOES JYN:82956} want a pregnancy in the next year.  Desired family size is {NUMBER 1-10:22536} children.  - Reviewed reproductive life planning. Reviewed contraceptive methods based on pt preferences and effectiveness.  Patient desired {Upstream End Methods:24109} today.   - Discussed birth spacing of 18 months  4. Sleep and fatigue -Encouraged family/partner/community support of 4 hrs of uninterrupted sleep to help with mood and fatigue  5. Physical Recovery  - Discussed patients delivery and complications. She describes her labor as {description:25511} - Patient had a {CHL AMB [phone]}. Patient had a {laceration:25518} laceration. Perineal healing reviewed. Patient expressed understanding - Patient has urinary incontinence? {yes/no:25515} - Patient {ACTION; IS/IS OZH:08657846} safe to resume physical and sexual activity  6.  Health Maintenance - HM due items addressed {Yes or If no, why not?:20788} - Last pap smear  Diagnosis  Date Value Ref Range Status  06/25/2021   Final   - Negative for Intraepithelial Lesions or Malignancy (NILM)  06/25/2021 - Benign reactive/reparative changes  Final   Pap smear {done:10129} at today's visit.  -Breast Cancer screening indicated? {indicated:25516}  7. Chronic Disease/Pregnancy Condition follow up: {Follow up:25499}  - PCP follow up  Scheryl Marten, RN Center for [employer], [facility]

## 2022-01-26 NOTE — Progress Notes (Signed)
erroneous  This encounter was created in error - please disregard.

## 2022-02-22 ENCOUNTER — Ambulatory Visit (INDEPENDENT_AMBULATORY_CARE_PROVIDER_SITE_OTHER): Payer: Medicaid Other | Admitting: Obstetrics and Gynecology

## 2022-02-22 MED ORDER — NORGESTIMATE-ETH ESTRADIOL 0.25-35 MG-MCG PO TABS: 1.0000 | ORAL_TABLET | Freq: Every day | ORAL | 1 refills | Status: DC

## 2022-02-22 NOTE — Progress Notes (Unsigned)
    [place] Partum Visit Note  Maria Pugh is a 37 y.o. [id number] s/p an 11/18 SVD/intact perineum at 39wks after an elective IOL.  Anesthesia: epidural. Postpartum course has been uncomplicated. Baby is doing well . Baby is feeding by bottle - trying different brands right now . Bleeding no bleeding. Bowel function is normal. Bladder function is normal. Patient is sexually active. Contraception method is  Slynd  . Postpartum depression screening: negative.  Patient started the [facility] and is on the 2nd pack. She is having qday, spotting of dark blood; she didn't really have a period on the placebo pills. She is comfort feeding at night only.    Edinburgh Postnatal Depression Scale - 02/22/22 1628       Edinburgh Postnatal Depression Scale:  In the Past 7 Days   I have been able to laugh and see the funny side of things. 0    I have looked forward with enjoyment to things. 0    I have blamed myself unnecessarily when things went wrong. 0    I have been anxious or worried for no good reason. 0    I have felt scared or panicky for no good reason. 0    Things have been getting on top of me. 0    I have been so unhappy that I have had difficulty sleeping. 0    I have felt sad or miserable. 0    I have been so unhappy that I have been crying. 0    The thought of harming myself has occurred to me. 0    Edinburgh Postnatal Depression Scale Total 0             Review of Systems Pertinent items noted in HPI and remainder of comprehensive ROS otherwise negative.  Objective:  BP 121/74   Pulse 67   Wt 132 lb (59.9 kg)   BMI 24.14 kg/m    General: NAD  Assessment:   Routine postpartum visit.   Plan:  *Postpartum: routine care. Patient likes OCPs and would like to go back on combined OCPs like she was on in the past. She was on Sprintec with good success; she states she got pregnant on them when a substitute was made at the pharmacy when it wasn't in stock. Sprintec 67m with one  refill sent in. I told her that if this doesn't resolve her AUB after a month or two of use to let us know.  - Last pap smear  Diagnosis  Date Value Ref Range Status  06/25/2021   Final   - Negative for Intraepithelial Lesions or Malignancy (NILM)  06/25/2021 - Benign reactive/reparative changes  Final   RTC 3 months for annual   Aletha Halim, MD Center for [place]

## 2022-02-23 ENCOUNTER — Encounter: Payer: Self-pay | Admitting: Obstetrics and Gynecology

## 2022-03-02 ENCOUNTER — Other Ambulatory Visit: Payer: Self-pay | Admitting: *Deleted

## 2022-03-02 ENCOUNTER — Telehealth: Payer: Self-pay | Admitting: *Deleted

## 2022-03-02 MED ORDER — NORGESTIM-ETH ESTRAD TRIPHASIC 0.18/0.215/0.25 MG-25 MCG PO TABS: 1.0000 | ORAL_TABLET | Freq: Every day | ORAL | 11 refills | Status: DC

## 2022-03-02 NOTE — Telephone Encounter (Signed)
Pt called and stated the pharmacy said ortho-tri cyclen was discontinued and the generic was on backorder. Per Dr Ilda Basset, sent in Tri-Phasic instead, pt informed.

## 2022-05-01 ENCOUNTER — Telehealth: Payer: Medicaid Other | Admitting: Family Medicine

## 2022-05-01 ENCOUNTER — Emergency Department: Payer: Medicaid Other

## 2022-05-01 ENCOUNTER — Other Ambulatory Visit: Payer: Self-pay

## 2022-05-01 DIAGNOSIS — N132 Hydronephrosis with renal and ureteral calculous obstruction: Secondary | ICD-10-CM | POA: Insufficient documentation

## 2022-05-01 DIAGNOSIS — N200 Calculus of kidney: Secondary | ICD-10-CM | POA: Diagnosis not present

## 2022-05-01 DIAGNOSIS — R109 Unspecified abdominal pain: Secondary | ICD-10-CM | POA: Diagnosis present

## 2022-05-01 DIAGNOSIS — N390 Urinary tract infection, site not specified: Secondary | ICD-10-CM

## 2022-05-01 DIAGNOSIS — N201 Calculus of ureter: Secondary | ICD-10-CM | POA: Diagnosis not present

## 2022-05-01 LAB — COMPREHENSIVE METABOLIC PANEL
ALT: 16 U/L (ref 0–44)
AST: 20 U/L (ref 15–41)
Albumin: 4.3 g/dL (ref 3.5–5.0)
Alkaline Phosphatase: 49 U/L (ref 38–126)
Anion gap: 6 (ref 5–15)
BUN: 8 mg/dL (ref 6–20)
CO2: 21 mmol/L — ABNORMAL LOW (ref 22–32)
Calcium: 8.5 mg/dL — ABNORMAL LOW (ref 8.9–10.3)
Chloride: 108 mmol/L (ref 98–111)
Creatinine, Ser: 0.81 mg/dL (ref 0.44–1.00)
GFR, Estimated: >60 mL/min (ref >60)
Glucose, Bld: 117 mg/dL — ABNORMAL HIGH (ref 70–99)
Potassium: 3.5 mmol/L (ref 3.5–5.1)
Sodium: 135 mmol/L (ref 135–145)
Total Bilirubin: 0.6 mg/dL (ref 0.3–1.2)
Total Protein: 7.6 g/dL (ref 6.5–8.1)

## 2022-05-01 LAB — URINALYSIS, ROUTINE W REFLEX MICROSCOPIC
Bilirubin Urine: NEGATIVE
Glucose, UA: NEGATIVE mg/dL
Ketones, ur: NEGATIVE mg/dL
Leukocytes,Ua: NEGATIVE
Nitrite: POSITIVE — AB
Protein, ur: NEGATIVE mg/dL
RBC / HPF: >50 RBC/hpf (ref 0–5)
Specific Gravity, Urine: 1.02 (ref 1.005–1.030)
pH: 5 (ref 5.0–8.0)

## 2022-05-01 LAB — CBC
HCT: 38.2 % (ref 36.0–46.0)
Hemoglobin: 12.6 g/dL (ref 12.0–15.0)
MCH: 31.3 pg (ref 26.0–34.0)
MCHC: 33 g/dL (ref 30.0–36.0)
MCV: 94.8 fL (ref 80.0–100.0)
Platelets: 370 10*3/uL (ref 150–400)
RBC: 4.03 MIL/uL (ref 3.87–5.11)
RDW: 14.5 % (ref 11.5–15.5)
WBC: 6.7 10*3/uL (ref 4.0–10.5)
nRBC: 0 % (ref 0.0–0.2)

## 2022-05-01 LAB — POC URINE PREG, ED: Preg Test, Ur: NEGATIVE

## 2022-05-01 MED ORDER — CEPHALEXIN 500 MG PO CAPS: 500.0000 mg | ORAL_CAPSULE | Freq: Three times a day (TID) | ORAL | 0 refills | Status: DC

## 2022-05-01 MED ORDER — KETOROLAC TROMETHAMINE 15 MG/ML IJ SOLN
15.0000 mg | Freq: Once | INTRAMUSCULAR | Status: AC
  Administered 2022-05-01: 15 mg via INTRAVENOUS
  Filled 2022-05-01: qty 1

## 2022-05-01 MED ORDER — FLUCONAZOLE 150 MG PO TABS: 150.0000 mg | ORAL_TABLET | Freq: Once | ORAL | 0 refills | Status: AC

## 2022-05-01 MED ORDER — KETOROLAC TROMETHAMINE 30 MG/ML IJ SOLN
15.0000 mg | Freq: Once | INTRAMUSCULAR | Status: AC
  Administered 2022-05-02: 15 mg via INTRAVENOUS
  Filled 2022-05-01: qty 1

## 2022-05-01 MED ORDER — MORPHINE SULFATE (PF) 4 MG/ML IV SOLN
4.0000 mg | Freq: Once | INTRAVENOUS | Status: AC
  Administered 2022-05-01: 4 mg via INTRAVENOUS
  Filled 2022-05-01: qty 1

## 2022-05-01 MED ORDER — MORPHINE SULFATE (PF) 4 MG/ML IV SOLN
4.0000 mg | Freq: Once | INTRAVENOUS | Status: AC
  Administered 2022-05-02: 4 mg via INTRAVENOUS
  Filled 2022-05-01: qty 1

## 2022-05-01 MED ORDER — ONDANSETRON HCL 4 MG/2ML IJ SOLN
4.0000 mg | Freq: Once | INTRAMUSCULAR | Status: AC
  Administered 2022-05-01: 4 mg via INTRAVENOUS
  Filled 2022-05-01: qty 2

## 2022-05-01 NOTE — Progress Notes (Signed)
Virtual Visit Consent   Maria Pugh, you are scheduled for a virtual visit with a [facility] provider today. Just as with appointments in the office, your consent must be obtained to participate. Your consent will be active for this visit and any virtual visit you may have with one of our providers in the next 365 days. If you have a MyChart account, a copy of this consent can be sent to you electronically.  As this is a virtual visit, video technology does not allow for your provider to perform a traditional examination. This may limit your provider's ability to fully assess your condition. If your provider identifies any concerns that need to be evaluated in person or the need to arrange testing (such as labs, EKG, etc.), we will make arrangements to do so. Although advances in technology are sophisticated, we cannot ensure that it will always work on either your end or our end. If the connection with a video visit is poor, the visit may have to be switched to a telephone visit. With either a video or telephone visit, we are not always able to ensure that we have a secure connection.  By engaging in this virtual visit, you consent to the provision of healthcare and authorize for your insurance to be billed (if applicable) for the services provided during this visit. Depending on your insurance coverage, you may receive a charge related to this service.  I need to obtain your verbal consent now. Are you willing to proceed with your visit today? Maria Pugh has provided verbal consent on 05/01/2022 for a virtual visit (video or telephone). Dellia Nims, FNP  Date: 05/01/2022 8:39 AM  Virtual Visit via Video Note   I, Dellia Nims, connected with  Maria Pugh  ([id number], [birth year]) on 05/01/22 at  8:30 AM EDT by a video-enabled telemedicine application and verified that I am speaking with the correct person using two identifiers.  Location: Patient: Virtual Visit Location Patient:  Home Provider: Virtual Visit Location Provider: Home Office   I discussed the limitations of evaluation and management by telemedicine and the availability of in person appointments. The patient expressed understanding and agreed to proceed.    History of Present Illness: Maria Pugh is a 37 y.o. who identifies as a female who was assigned female at birth, and is being seen today for bladder spasms and dysuria since Thursday. No fever. Maria Pugh Has back pain. Is not pregnant or breast feeding.   HPI: HPI  Problems:  Patient Active Problem List   Diagnosis Date Noted   H/O LEEP (loop electrosurgical excision procedure) of cervix complicating pregnancy, second trimester 07/23/2021   Anxiety and depression 01/31/2017    Allergies: No Known Allergies Medications:  Current Outpatient Medications:    Norgestimate-Ethinyl Estradiol Triphasic 0.18/0.215/0.25 MG-25 MCG tab, Take 1 tablet by mouth daily., Disp: 28 tablet, Rfl: 11   acetaminophen (TYLENOL) 325 MG tablet, Take 2 tablets (650 mg total) by mouth every 4 (four) hours as needed (for pain scale < 4)., Disp: 30 tablet, Rfl: 0   ibuprofen (ADVIL) 600 MG tablet, Take 1 tablet (600 mg total) by mouth every 6 (six) hours., Disp: 30 tablet, Rfl: 0   norgestimate-ethinyl estradiol (ORTHO-CYCLEN) 0.25-35 MG-MCG tablet, Take 1 tablet by mouth daily., Disp: 180 tablet, Rfl: 1   prenatal vitamin w/FE, FA (PRENATAL 1 + 1) 27-1 MG TABS tablet, Take 1 tablet by mouth daily at 12 noon., Disp: , Rfl:   Observations/Objective: Patient is well-developed,  well-nourished in no acute distress.  Resting comfortably  at home.  Head is normocephalic, atraumatic.  No labored breathing.  Speech is clear and coherent with logical content.  Patient is alert and oriented at baseline.    Assessment and Plan: 1. Urinary tract infection without hematuria, site unspecified  Increase fluids,   Follow Up Instructions: I discussed the assessment and treatment plan  with the patient. The patient was provided an opportunity to ask questions and all were answered. The patient agreed with the plan and demonstrated an understanding of the instructions.  A copy of instructions were sent to the patient via MyChart unless otherwise noted below.     The patient was advised to call back or seek an in-person evaluation if the symptoms worsen or if the condition fails to improve as anticipated.  Time:  I spent 10 minutes with the patient via telehealth technology discussing the above problems/concerns.    Dellia Nims, FNP

## 2022-05-01 NOTE — Patient Instructions (Signed)
Urinary Tract Infection, Adult  A urinary tract infection (UTI) is an infection of any part of the urinary tract. The urinary tract includes the kidneys, ureters, bladder, and urethra. These organs make, store, and get rid of urine in the body. An upper UTI affects the ureters and kidneys. A lower UTI affects the bladder and urethra. What are the causes? Most urinary tract infections are caused by bacteria in your genital area around your urethra, where urine leaves your body. These bacteria grow and cause inflammation of your urinary tract. What increases the risk? You are more likely to develop this condition if: You have a urinary catheter that stays in place. You are not able to control when you urinate or have a bowel movement (incontinence). You are female and you: Use a spermicide or diaphragm for birth control. Have low estrogen levels. Are pregnant. You have certain genes that increase your risk. You are sexually active. You take antibiotic medicines. You have a condition that causes your flow of urine to slow down, such as: An enlarged prostate, if you are female. Blockage in your urethra. A kidney stone. A nerve condition that affects your bladder control (neurogenic bladder). Not getting enough to drink, or not urinating often. You have certain medical conditions, such as: Diabetes. A weak disease-fighting system (immunesystem). Sickle cell disease. Gout. Spinal cord injury. What are the signs or symptoms? Symptoms of this condition include: Needing to urinate right away (urgency). Frequent urination. This may include small amounts of urine each time you urinate. Pain or burning with urination. Blood in the urine. Urine that smells bad or unusual. Trouble urinating. Cloudy urine. Vaginal discharge, if you are female. Pain in the abdomen or the lower back. You may also have: Vomiting or a decreased appetite. Confusion. Irritability or tiredness. A fever or  chills. Diarrhea. The first symptom in older adults may be confusion. In some cases, they may not have any symptoms until the infection has worsened. How is this diagnosed? This condition is diagnosed based on your medical history and a physical exam. You may also have other tests, including: Urine tests. Blood tests. Tests for STIs (sexually transmitted infections). If you have had more than one UTI, a cystoscopy or imaging studies may be done to determine the cause of the infections. How is this treated? Treatment for this condition includes: Antibiotic medicine. Over-the-counter medicines to treat discomfort. Drinking enough water to stay hydrated. If you have frequent infections or have other conditions such as a kidney stone, you may need to see a health care provider who specializes in the urinary tract (urologist). In rare cases, urinary tract infections can cause sepsis. Sepsis is a life-threatening condition that occurs when the body responds to an infection. Sepsis is treated in the hospital with IV antibiotics, fluids, and other medicines. Follow these instructions at home:  Medicines Take over-the-counter and prescription medicines only as told by your health care provider. If you were prescribed an antibiotic medicine, take it as told by your health care provider. Do not stop using the antibiotic even if you start to feel better. General instructions Make sure you: Empty your bladder often and completely. Do not hold urine for long periods of time. Empty your bladder after sex. Wipe from front to back after urinating or having a bowel movement if you are female. Use each tissue only one time when you wipe. Drink enough fluid to keep your urine pale yellow. Keep all follow-up visits. This is important. Contact a health   care provider if: Your symptoms do not get better after 1-2 days. Your symptoms go away and then return. Get help right away if: You have severe pain in  your back or your lower abdomen. You have a fever or chills. You have nausea or vomiting. Summary A urinary tract infection (UTI) is an infection of any part of the urinary tract, which includes the kidneys, ureters, bladder, and urethra. Most urinary tract infections are caused by bacteria in your genital area. Treatment for this condition often includes antibiotic medicines. If you were prescribed an antibiotic medicine, take it as told by your health care provider. Do not stop using the antibiotic even if you start to feel better. Keep all follow-up visits. This is important. This information is not intended to replace advice given to you by your health care provider. Make sure you discuss any questions you have with your health care provider. Document Revised: 09/14/2019 Document Reviewed: 09/14/2019 Elsevier Patient Education  2023 Elsevier Inc.  

## 2022-05-01 NOTE — ED Triage Notes (Signed)
Pt presents to the ER from home with right flank pain. Pt reports she had a virtual visit this morning and was diagnosed with a UTI. Pt reports took one dose of Keflex, but pain has increased and was informed by her virtual provider to come to ER. Pt reports burning sensation with urination, unsure if she has blood in urine. Pt reports took AZO and urine is orange in color. Pt has nausea. Pt talks in complete sentences no respiratory distress noted.

## 2022-05-02 ENCOUNTER — Other Ambulatory Visit: Payer: Self-pay

## 2022-05-02 ENCOUNTER — Emergency Department
Admission: EM | Admit: 2022-05-02 | Discharge: 2022-05-02 | Disposition: A | Discharge status: Home / Self Care | Payer: Medicaid Other | Source: Home / Self Care | Attending: Emergency Medicine | Admitting: Emergency Medicine

## 2022-05-02 ENCOUNTER — Emergency Department
Admission: EM | Admit: 2022-05-02 | Discharge: 2022-05-02 | Disposition: A | Discharge status: Home / Self Care | Payer: Medicaid Other | Attending: Emergency Medicine | Admitting: Emergency Medicine

## 2022-05-02 DIAGNOSIS — N200 Calculus of kidney: Secondary | ICD-10-CM | POA: Insufficient documentation

## 2022-05-02 DIAGNOSIS — N201 Calculus of ureter: Secondary | ICD-10-CM

## 2022-05-02 MED ORDER — ONDANSETRON 4 MG PO TBDP: 4.0000 mg | ORAL_TABLET | Freq: Four times a day (QID) | ORAL | 0 refills | Status: DC | PRN

## 2022-05-02 MED ORDER — ONDANSETRON HCL 4 MG/2ML IJ SOLN
4.0000 mg | Freq: Once | INTRAMUSCULAR | Status: AC
  Administered 2022-05-02: 4 mg via INTRAVENOUS
  Filled 2022-05-02: qty 2

## 2022-05-02 MED ORDER — HYDROMORPHONE HCL 1 MG/ML IJ SOLN
1.0000 mg | Freq: Once | INTRAMUSCULAR | Status: AC
  Administered 2022-05-02: 1 mg via INTRAVENOUS
  Filled 2022-05-02: qty 1

## 2022-05-02 MED ORDER — OXYCODONE-ACETAMINOPHEN 5-325 MG PO TABS: 1.0000 | ORAL_TABLET | ORAL | 0 refills | Status: DC | PRN

## 2022-05-02 MED ORDER — SODIUM CHLORIDE 0.9 % IV BOLUS
1000.0000 mL | Freq: Once | INTRAVENOUS | Status: AC
  Administered 2022-05-02: 1000 mL via INTRAVENOUS

## 2022-05-02 MED ORDER — TAMSULOSIN HCL 0.4 MG PO CAPS: 0.4000 mg | ORAL_CAPSULE | Freq: Every day | ORAL | 0 refills | Status: DC

## 2022-05-02 MED ORDER — IBUPROFEN 800 MG PO TABS: 800.0000 mg | ORAL_TABLET | Freq: Three times a day (TID) | ORAL | 0 refills | Status: DC | PRN

## 2022-05-02 MED ORDER — OXYCODONE-ACETAMINOPHEN 5-325 MG PO TABS: 2.0000 | ORAL_TABLET | Freq: Four times a day (QID) | ORAL | 0 refills | Status: DC | PRN

## 2022-05-02 MED ORDER — SODIUM CHLORIDE 0.9 % IV SOLN: INTRAVENOUS | Status: DC

## 2022-05-02 NOTE — ED Provider Notes (Signed)
[facility] Provider Note    Event Date/Time   First MD Initiated Contact with Patient 05/02/22 0206     (approximate)   History   Flank Pain   HPI  Maria Pugh is a 37 y.o. female with no significant past medical history who presents emergency department with right flank pain, dysuria and hematuria with nausea.  Had a telemedicine visit yesterday and was diagnosed with UTI and started antibiotics but symptoms worsened.  No vomiting.  No fever.  Has been taking over-the-counter Azo.  No history of kidney stones previously.   History provided by patient, husband.    Past Medical History:  Diagnosis Date   Abnormal uterine bleeding (AUB) 11/15/2016   Anxiety    Depression    Discharge from left nipple 04/11/2019   Galactorrhea    Gestational hypertension    History of abnormal cervical Pap smear 08/09/2014   LEEP. Follow-up Paps normal.   History of gestational hypertension 06/25/2021   Pancreatitis    Shingles 2013 and 2014   Vaginal Pap smear, abnormal     Past Surgical History:  Procedure Laterality Date   LEEP     MOUTH SURGERY  07/2014    MEDICATIONS:  Prior to Admission medications   Medication Sig Start Date End Date Taking? Authorizing Provider  Norgestimate-Ethinyl Estradiol Triphasic 0.18/0.215/0.25 MG-25 MCG tab Take 1 tablet by mouth daily. 03/02/22   Aletha Halim, MD  acetaminophen (TYLENOL) 325 MG tablet Take 2 tablets (650 mg total) by mouth every 4 (four) hours as needed (for pain scale < 4). 01/03/22   Autry-Lott, Naaman Plummer, DO  cephALEXin (KEFLEX) 500 MG capsule Take 1 capsule (500 mg total) by mouth 3 (three) times daily for 7 days. 05/01/22 05/08/22  Tempie Hoist, FNP  ibuprofen (ADVIL) 600 MG tablet Take 1 tablet (600 mg total) by mouth every 6 (six) hours. 01/03/22   Autry-Lott, Naaman Plummer, DO  norgestimate-ethinyl estradiol (ORTHO-CYCLEN) 0.25-35 MG-MCG tablet Take 1 tablet by mouth daily. 02/22/22   Aletha Halim, MD   prenatal vitamin w/FE, FA (PRENATAL 1 + 1) 27-1 MG TABS tablet Take 1 tablet by mouth daily at 12 noon.    [provider]    Physical Exam   Triage Vital Signs: ED Triage Vitals  Enc Vitals Group     BP 05/01/22 2011 123/86     Pulse Rate 05/01/22 2011 83     Resp 05/01/22 2011 16     Temp 05/01/22 2011 97.8 F (36.6 C)     Temp Source 05/01/22 2011 Oral     SpO2 05/01/22 2011 100 %     Weight 05/01/22 2012 124 lb (56.2 kg)     Height 05/01/22 2012 5' (1.524 m)     Head Circumference --      Peak Flow --      Pain Score 05/01/22 2011 9     Pain Loc --      Pain Edu? --      Excl. in [place]? --     Most recent vital signs: Vitals:   05/02/22 0423 05/02/22 0430  BP:  116/78  Pulse: 69 69  Resp:  18  Temp:    SpO2: 97% 98%    CONSTITUTIONAL: Alert, responds appropriately to questions.  Peers uncomfortable HEAD: Normocephalic, atraumatic EYES: Conjunctivae clear, pupils appear equal, sclera nonicteric ENT: normal nose; moist mucous membranes NECK: Supple, normal ROM CARD: RRR; S1 and S2 appreciated RESP: Normal chest excursion without splinting  or tachypnea; breath sounds clear and equal bilaterally; no wheezes, no rhonchi, no rales, no hypoxia or respiratory distress, speaking full sentences ABD/GI: Non-distended; soft, non-tender, no rebound, no guarding, no peritoneal signs BACK: The back appears normal EXT: Normal ROM in all joints; no deformity noted, no edema SKIN: Normal color for age and race; warm; no rash on exposed skin NEURO: Moves all extremities equally, normal speech PSYCH: The patient's mood and manner are appropriate.   ED Results / Procedures / Treatments   LABS: (all labs ordered are listed, but only abnormal results are displayed) Labs Reviewed  COMPREHENSIVE METABOLIC PANEL - Abnormal; Notable for the following components:      Result Value   CO2 21 (*)    Glucose, Bld 117 (*)    Calcium 8.5 (*)    All other components within normal  limits  URINALYSIS, ROUTINE W REFLEX MICROSCOPIC - Abnormal; Notable for the following components:   Color, Urine AMBER (*)    APPearance HAZY (*)    Hgb urine dipstick SMALL (*)    Nitrite POSITIVE (*)    Bacteria, UA RARE (*)    All other components within normal limits  URINE CULTURE  CBC  POC URINE PREG, ED     EKG:   RADIOLOGY: My personal review and interpretation of imaging: CT scan shows 4 mm right distal ureteral stone.  I have personally reviewed all radiology reports.   CT Renal Stone Study  Result Date: 05/01/2022 CLINICAL DATA:  Abdominal/flank pain, stone suspected. Right flank pain EXAM: CT ABDOMEN AND PELVIS WITHOUT CONTRAST TECHNIQUE: Multidetector CT imaging of the abdomen and pelvis was performed following the standard protocol without IV contrast. RADIATION DOSE REDUCTION: This exam was performed according to the departmental dose-optimization program which includes automated exposure control, adjustment of the mA and/or kV according to patient size and/or use of iterative reconstruction technique. COMPARISON:  None Available. FINDINGS: Lower chest: No acute abnormality Hepatobiliary: No focal hepatic abnormality. Gallbladder unremarkable. Pancreas: No focal abnormality or ductal dilatation. Spleen: No focal abnormality.  Normal size. Adrenals/Urinary Tract: Right hydronephrosis and hydroureter due to 4 mm distal right ureteral stone. No stones or hydronephrosis on the left. Adrenal glands and urinary bladder unremarkable. Stomach/Bowel: Stomach, large and small bowel grossly unremarkable. Vascular/Lymphatic: No evidence of aneurysm or adenopathy. Reproductive: Uterus and adnexa unremarkable.  No mass. Other: No free fluid or free air. Musculoskeletal: No acute bony abnormality. IMPRESSION: 4 mm distal right ureteral stone with moderate right hydroureteronephrosis. Electronically Signed   By: Rolm Baptise M.D.   On: 05/01/2022 22:09     PROCEDURES:  Critical Care  performed: No     Procedures    IMPRESSION / MDM / ASSESSMENT AND PLAN / ED COURSE  I reviewed the triage vital signs and the nursing notes.    Patient here with complaints of flank pain.  Recently diagnosed with UTI through telemedicine visit, on antibiotics without relief.   DIFFERENTIAL DIAGNOSIS (includes but not limited to):   UTI, pyelonephritis, kidney stone, doubt appendicitis    Patient's presentation is most consistent with acute presentation with potential threat to life or bodily function.   PLAN: Workup initiated from triage.  No leukocytosis, normal creatinine, urine is nitrite positive but patient has been on Azo which is likely the cause of this.  She has a large amount of red blood cells.  She does have some mild pyuria and bacteria but also many squamous cells.  Low suspicion that this is an acute  infection.  Will send off urine culture.  Pregnancy test negative.  CT scan obtained from triage reviewed and interpreted by myself and the radiologist and shows a 4 mm distal right ureteral stone with moderate right hydroureteronephrosis.  No signs of superimposed infection on CT.  She has received Toradol and morphine x 2 from triage is still uncomfortable with a 6/10 pain.  Will give Dilaudid and reassess.   MEDICATIONS GIVEN IN ED: Medications  0.9 %  sodium chloride infusion (0 mLs Intravenous Stopped 05/02/22 0425)  ondansetron (ZOFRAN) injection 4 mg (4 mg Intravenous Given 05/01/22 2024)  ketorolac (TORADOL) 15 MG/ML injection 15 mg (15 mg Intravenous Given 05/01/22 2024)  morphine (PF) 4 MG/ML injection 4 mg (4 mg Intravenous Given 05/01/22 2024)  ketorolac (TORADOL) 30 MG/ML injection 15 mg (15 mg Intravenous Given 05/02/22 0039)  morphine (PF) 4 MG/ML injection 4 mg (4 mg Intravenous Given 05/02/22 0041)  HYDROmorphone (DILAUDID) injection 1 mg (1 mg Intravenous Given 05/02/22 0230)  ondansetron (ZOFRAN) injection 4 mg (4 mg Intravenous Given 05/02/22 0230)   HYDROmorphone (DILAUDID) injection 1 mg (1 mg Intravenous Given 05/02/22 0351)  ondansetron (ZOFRAN) injection 4 mg (4 mg Intravenous Given 05/02/22 0446)     ED COURSE: Patient given Dilaudid and states she is feeling better.  I have offered her admission for pain control and to see urology the patient states she would prefer to go home as she has a young child.  She is not currently breast-feeding.  Will discharge with Percocet, Zofran, ibuprofen and Flomax.  Will give outpatient urology follow-up information and a urine strainer.    At this time, I do not feel there is any life-threatening condition present. I reviewed all nursing notes, vitals, pertinent previous records.  All lab and urine results, EKGs, imaging ordered have been independently reviewed and interpreted by myself.  I reviewed all available radiology reports from any imaging ordered this visit.  Based on my assessment, I feel the patient is safe to be discharged home without further emergent workup and can continue workup as an outpatient as needed. Discussed all findings, treatment plan as well as usual and customary return precautions.  They verbalize understanding and are comfortable with this plan.  Outpatient follow-up has been provided as needed.  All questions have been answered.    CONSULTS:  none   OUTSIDE RECORDS REVIEWED: Reviewed patient's admission note for spontaneous vaginal delivery in November 2023.       FINAL CLINICAL IMPRESSION(S) / ED DIAGNOSES   Final diagnoses:  Right ureteral calculus     Rx / DC Orders   ED Discharge Orders          Ordered    oxyCODONE-acetaminophen (PERCOCET) 5-325 MG tablet  Every 6 hours PRN        05/02/22 0336    ondansetron (ZOFRAN-ODT) 4 MG disintegrating tablet  Every 6 hours PRN        05/02/22 0336    ibuprofen (ADVIL) 800 MG tablet  Every 8 hours PRN        05/02/22 0336    tamsulosin (FLOMAX) 0.4 MG CAPS capsule  Daily        05/02/22 0336              Note:  This document was prepared using Dragon voice recognition software and may include unintentional dictation errors.   Zamariah Seaborn, Delice Bison, DO 05/02/22 [phone]

## 2022-05-02 NOTE — Discharge Instructions (Addendum)
Follow-up with your allergy as previously instructed.  Take pain medication as needed.  Drink plenty of water. I did confirm that the pharmacy in [place] received the prescription

## 2022-05-02 NOTE — ED Provider Notes (Signed)
[facility] Provider Note    Event Date/Time   First MD Initiated Contact with Patient 05/02/22 1308     (approximate)   History   Flank Pain   HPI  Maria Pugh is a 37 y.o. female with history of kidney stone that was diagnosed last night presents emergency department stating that the oxycodone prescription did not go to the pharmacy and is having increased pain today.  States she has not been able to drink a lot of water to help push the stone out.  Nursing staff did look up in the oxycodone prescription says failed to transmit.  She denies any new symptoms from last night.  Just pain control.      Physical Exam   Triage Vital Signs: ED Triage Vitals  Enc Vitals Group     BP 05/02/22 1238 129/86     Pulse Rate 05/02/22 1238 88     Resp 05/02/22 1238 17     Temp 05/02/22 1238 98 F (36.7 C)     Temp src --      SpO2 05/02/22 1238 98 %     Weight 05/02/22 1236 123 lb 7.3 oz (56 kg)     Height 05/02/22 1236 5' (1.524 m)     Head Circumference --      Peak Flow --      Pain Score 05/02/22 1236 10     Pain Loc --      Pain Edu? --      Excl. in [place]? --     Most recent vital signs: Vitals:   05/02/22 1238  BP: 129/86  Pulse: 88  Resp: 17  Temp: 98 F (36.7 C)  SpO2: 98%     General: Awake, no distress.   CV:  Good peripheral perfusion. regular rate and  rhythm Resp:  Normal effort.  Abd:  No distention.   Other:      ED Results / Procedures / Treatments   Labs (all labs ordered are listed, but only abnormal results are displayed) Labs Reviewed - No data to display   EKG     RADIOLOGY I did review CT from last night    PROCEDURES:   Procedures   MEDICATIONS ORDERED IN ED: Medications  sodium chloride 0.9 % bolus 1,000 mL (0 mLs Intravenous Stopped 05/02/22 1452)  ondansetron (ZOFRAN) injection 4 mg (4 mg Intravenous Given 05/02/22 1331)  HYDROmorphone (DILAUDID) injection 1 mg (1 mg Intravenous Given  05/02/22 1332)     IMPRESSION / MDM / [place] / ED COURSE  I reviewed the triage vital signs and the nursing notes.                              Differential diagnosis includes, but is not limited to, kidney stone, pain control, med refill  Patient's presentation is most consistent with acute, uncomplicated illness.   The patient is basically here for pain control.  Gave her Dilaudid 1 mg IV along with 1 L normal saline.  She is given a strainer in case she can pass the stone while here in the ED.  I did send another prescription of Percocet to [facility] on [address] is in [place].  I did verify that the prescription was received by the pharmacy.  The patient is to follow-up with urology as previously instructed.  Return emergency department worsening.  She is in agreement treatment plan.  Was  discharged stable condition in the care of her husband      FINAL CLINICAL IMPRESSION(S) / ED DIAGNOSES   Final diagnoses:  Kidney stone     Rx / DC Orders   ED Discharge Orders          Ordered    oxyCODONE-acetaminophen (PERCOCET) 5-325 MG tablet  Every 4 hours PRN        05/02/22 1421             Note:  This document was prepared using Dragon voice recognition software and may include unintentional dictation errors.    Versie Starks, PA-C 05/02/22 1648    Blake Divine, MD 05/03/22 [id number]

## 2022-05-02 NOTE — ED Triage Notes (Signed)
Pt reports was here last pm for flank pain. Pt states has a kidney stone. Pt reports MD offered admission for pain control but she declined because she has a small baby at home. Pt states she went to pick up her rx and the pharmacy had all of them except her Oxy. MD not able to re-prescribe oxycodone. Rx appears to have been sent, pharmacy reports they did not receive. Pt checking back in for pain control.

## 2022-05-02 NOTE — Discharge Instructions (Addendum)

## 2022-05-02 NOTE — ED Notes (Signed)
RN to bedside to introduce self to pt. Pt resting and in no acute distress.  

## 2022-05-03 LAB — URINE CULTURE: Culture: NO GROWTH

## 2022-05-04 ENCOUNTER — Telehealth: Payer: Self-pay

## 2022-05-04 NOTE — Transitions of Care (Post Inpatient/ED Visit) (Signed)
   05/04/2022  Name: Maria Pugh MRN: [id number] DOB: 1986/02/09  Today's TOC FU Call Status: Today's TOC FU Call Status:: Unsuccessul Call (1st Attempt) Unsuccessful Call (1st Attempt) Date: 05/04/22  Attempted to reach the patient regarding the most recent Inpatient/ED visit.  Follow Up Plan: Additional outreach attempts will be made to reach the patient to complete the Transitions of Care (Post Inpatient/ED visit) call.   Mickel Fuchs, BSW, [place] Managed Medicaid Team  [phone]

## 2022-05-05 ENCOUNTER — Telehealth: Payer: Self-pay

## 2022-05-05 NOTE — Transitions of Care (Post Inpatient/ED Visit) (Signed)
   05/05/2022  Name: Maria Pugh MRN: [id number] DOB: 06-15-1985  Today's TOC FU Call Status: Today's TOC FU Call Status:: Unsuccessful Call (2nd Attempt) Unsuccessful Call (2nd Attempt) Date: 05/05/22  Attempted to reach the patient regarding the most recent Inpatient/ED visit.  Follow Up Plan: Additional outreach attempts will be made to reach the patient to complete the Transitions of Care (Post Inpatient/ED visit) call.   Mickel Fuchs, BSW, [place] Managed Medicaid Team  [phone]

## 2022-09-14 IMAGING — US US OB COMP LESS 14 WK
1 series · 15 of 28 positions shown · non-contrast
Comparison: None.

CLINICAL DATA: Pelvic pain.

EXAM:
OBSTETRIC <14 WK ULTRASOUND
TECHNIQUE: Transabdominal ultrasound was performed for evaluation of the
gestation as well as the maternal uterus and adnexal regions.

[Series 1: us ob comp less 14 wks · 15 of 39 slices shown]
[im 1/39]
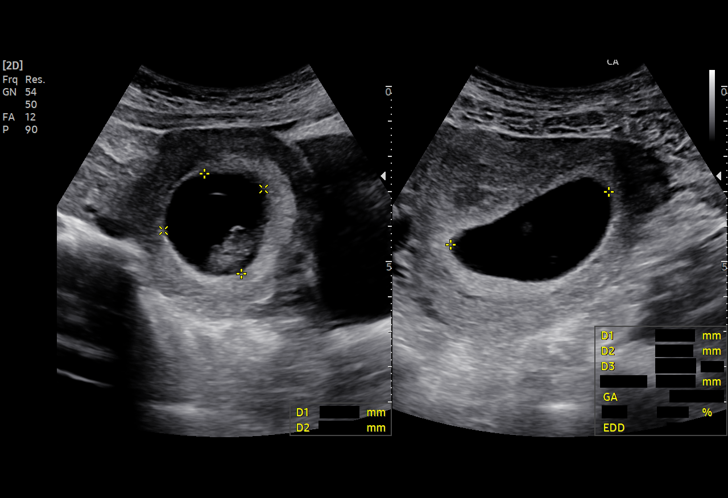
[im 3/39]
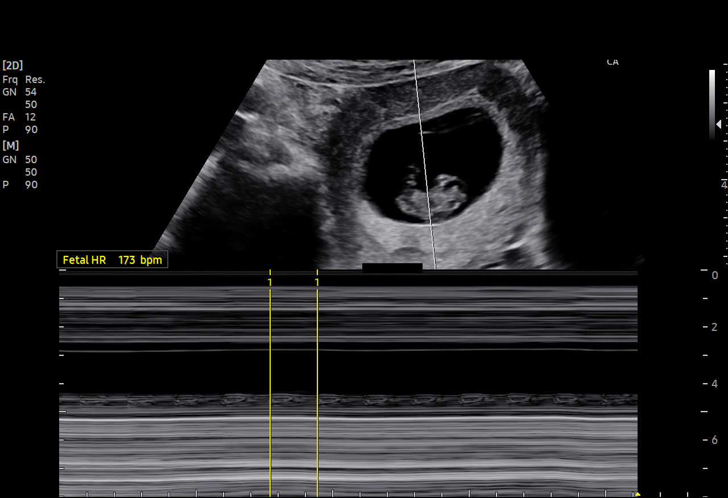
[im 6/39]
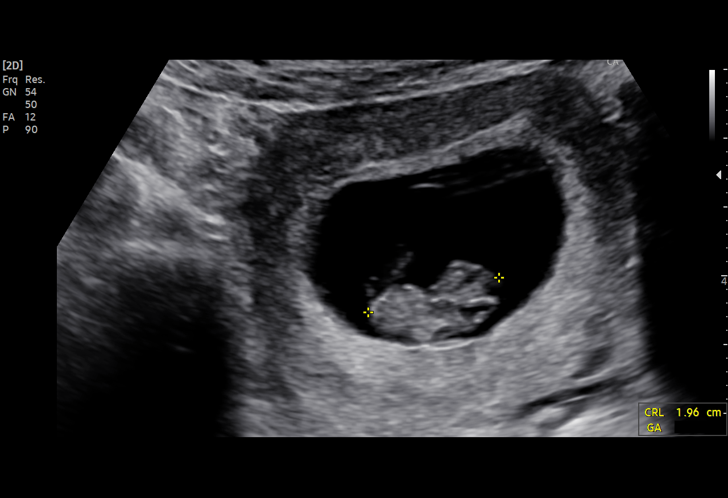
[im 9/39]
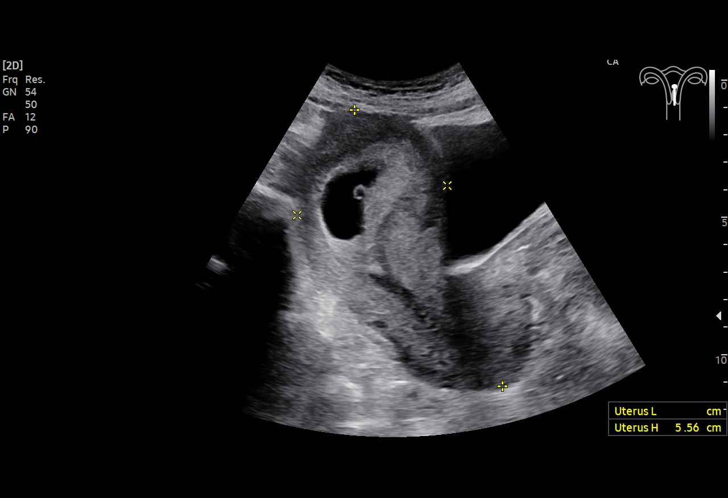
[im 12/39]
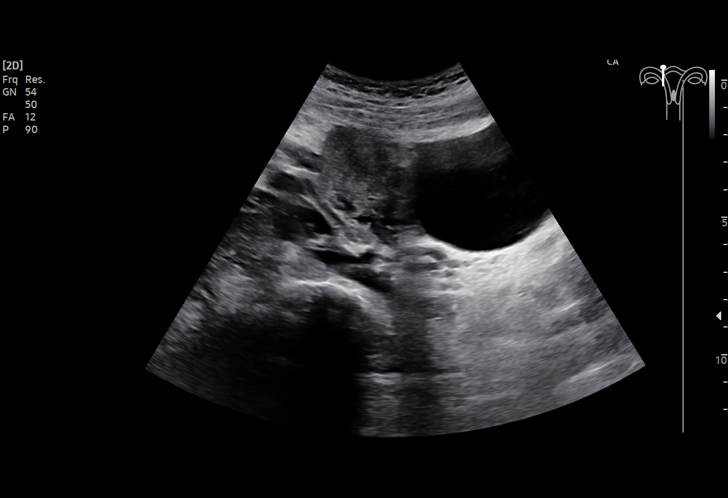
[im 15/39]
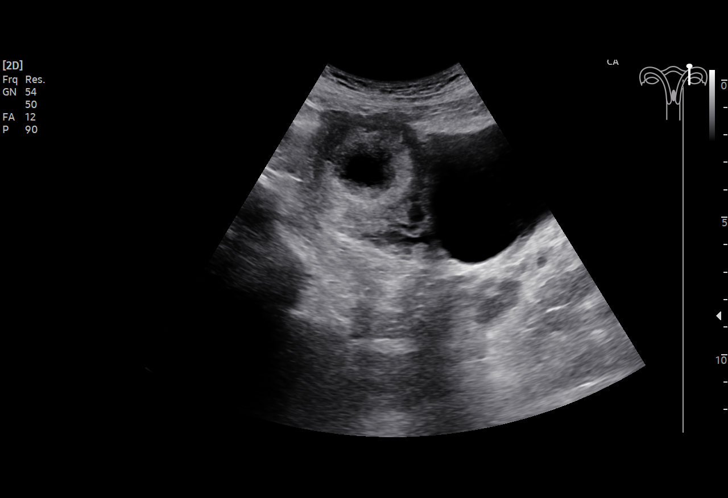
[im 17/39]
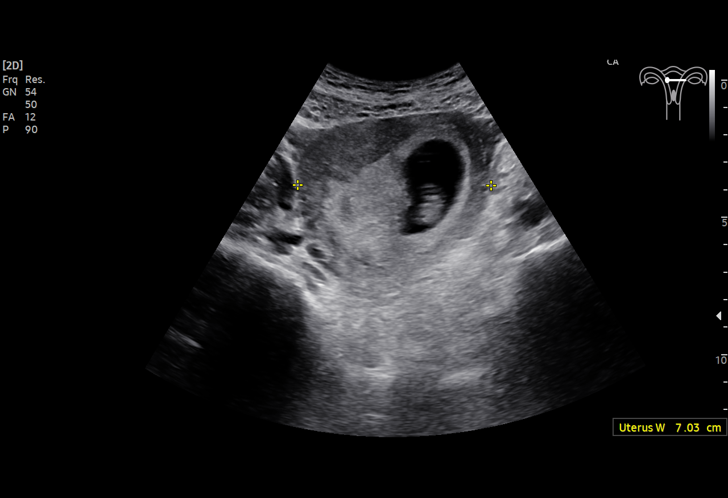
[im 20/39]
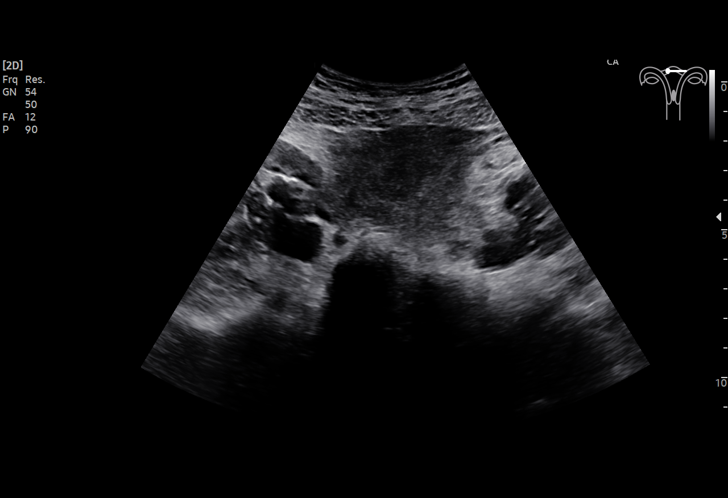
[im 22/39]
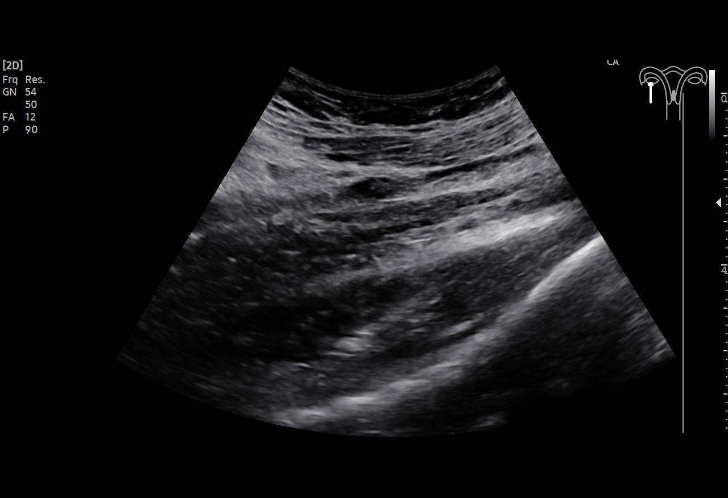
[im 24/39]
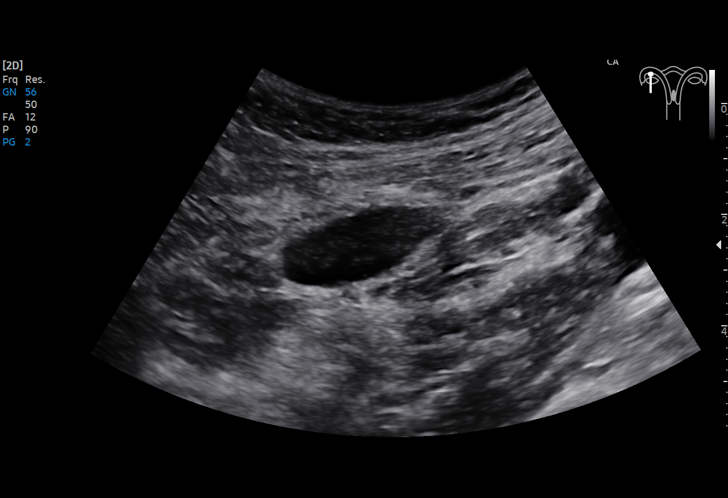
[im 27/39]
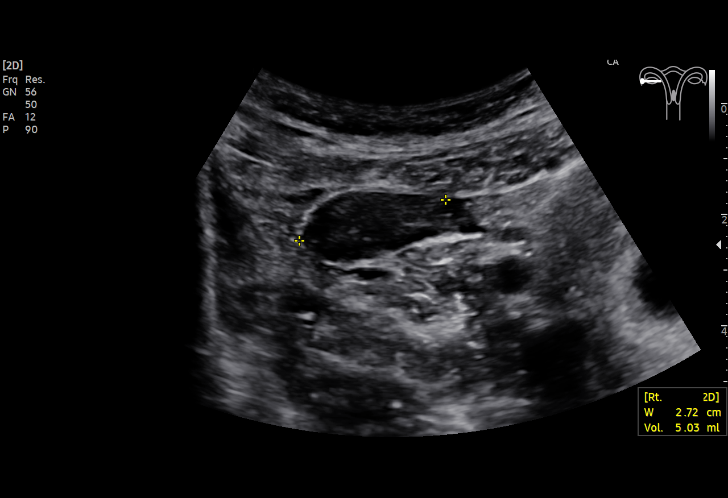
[im 30/39]
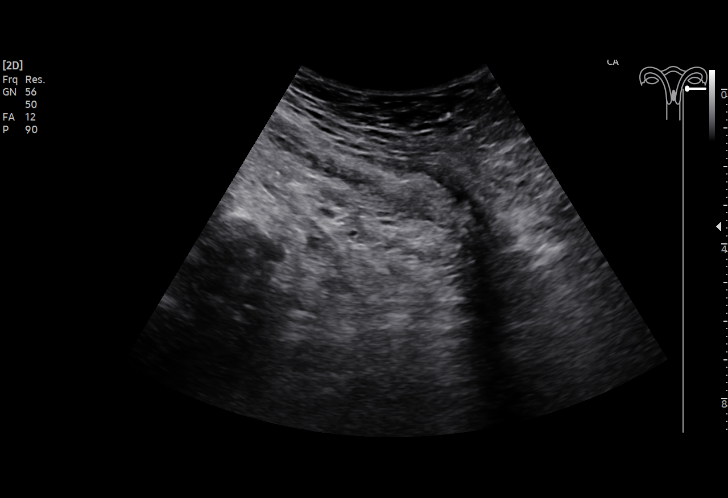
[im 33/39]
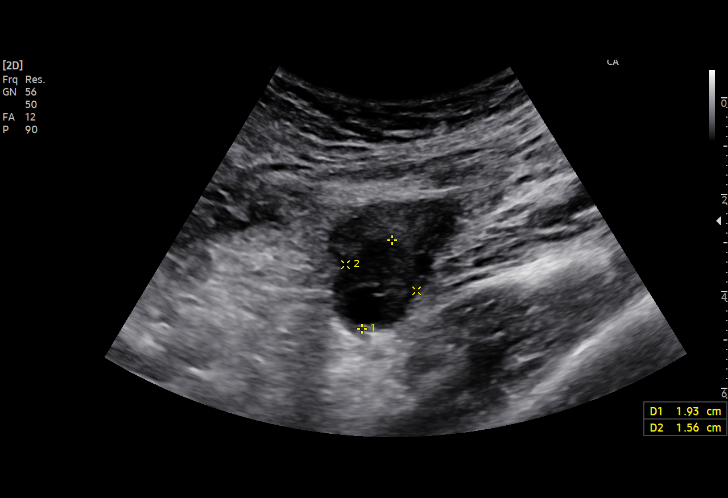
[im 36/39]
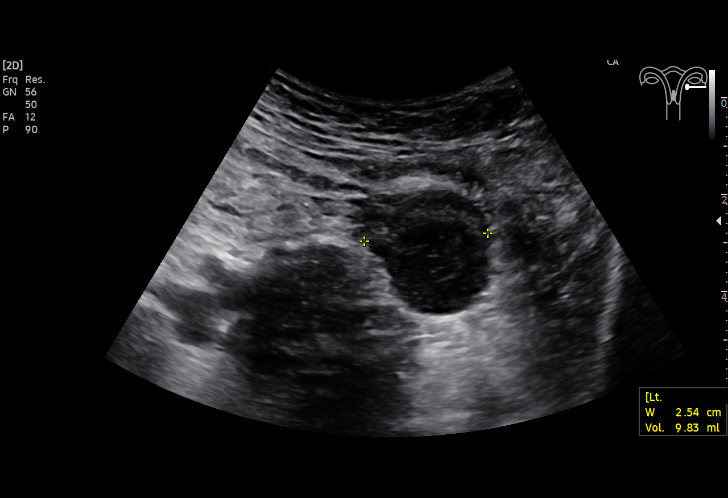
[im 39/39]
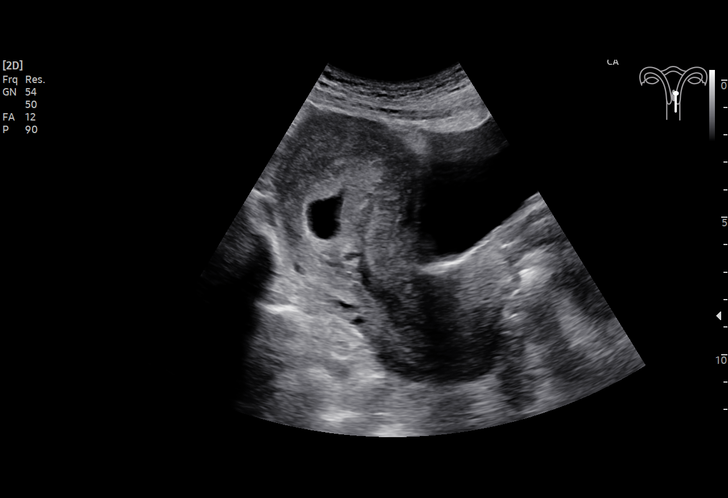

[15 of 28 positions shown; findings below may reference images not displayed]

FINDINGS: Intrauterine gestational sac: Single

Yolk sac:  Visualized.

Embryo:  Visualized.

Cardiac Activity: Visualized.

Heart Rate: 173 bpm

MSD:  36.2 mm   8 w   6 d

CRL:   19.1 mm   8 w 3 d                  US EDC: January 09, 2022

Subchorionic hemorrhage:  None visualized.

Maternal uterus/adnexae: The right ovary is visualized and is normal
in appearance.

A 1.9 cm x 1.6 cm x 1.9 cm corpus luteum cyst is seen within an
otherwise normal appearing left ovary.

No pelvic free fluid is seen.
IMPRESSION: Single, viable intrauterine pregnancy at approximately 8 weeks and 3
days gestation by ultrasound evaluation.

## 2023-01-10 ENCOUNTER — Ambulatory Visit (INDEPENDENT_AMBULATORY_CARE_PROVIDER_SITE_OTHER): Payer: Medicaid Other | Admitting: Obstetrics and Gynecology

## 2023-01-10 ENCOUNTER — Encounter: Payer: Self-pay | Admitting: Obstetrics and Gynecology

## 2023-01-10 VITALS — BP 105/71 | HR 89 | Wt 125.0 lb

## 2023-01-10 DIAGNOSIS — Z3041 Encounter for surveillance of contraceptive pills: Secondary | ICD-10-CM | POA: Diagnosis not present

## 2023-01-10 NOTE — Progress Notes (Unsigned)
RGYN  CC: No cycle this month. Last month cycles were shorter. Has concerns about peri-menopuse notes night sweats and getting hot off/on more at bed night , hair loss/thinning.  Contraception: pill  UPT at home negative. X 3 all negative.  Last pap: 06/25/21

## 2023-01-11 NOTE — Progress Notes (Signed)
Obstetrics and Gynecology Visit Return Patient Evaluation  Appointment Date: 01/10/2023  Primary Care Provider: Danelle Berry  OBGYN Clinic: [facility]  Chief Complaint: On OCPs, amenorrhea, hair changes, episodes of feeling hot  History of Present Illness:  Maria Pugh is a 37 y.o. with above CC. Patient was on POPs and then put on Sprintec at her [facility] visit January 2024; she states she hasn't breast fed for several months and she has beenon triphasic OCPs because the pharmacy had trouble obtaining sprintec, which she had been on in the past w/o issue.   Patient with no period for the past 2-3 cycles and with multiple negative home UPTs with one month she just had a brief episode of spotting and cramping.  Pap neg 2023.   Review of Systems: as noted in the History of Present Illness.   Medications:  Ione C. Shepp had no medications administered during this visit. Current Outpatient Medications  Medication Sig Dispense Refill   Norgestimate-Ethinyl Estradiol Triphasic 0.18/0.215/0.25 MG-25 MCG tab Take 1 tablet by mouth daily. 28 tablet 11   No current facility-administered medications for this visit.    Allergies: has No Known Allergies.  Physical Exam:  BP 105/71   Pulse 89   Wt 125 lb (56.7 kg)   BMI 24.41 kg/m  Body mass index is 24.41 kg/m. General appearance: Well nourished, well developed female in no acute distress.  Neuro/Psych:  Normal mood and affect.     Assessment: patient stable  Plan: I told her that it is not uncommon to have amenorrhea/very slight periods with chronic hormonal contraception usage and as long as it's not worry to her then okay to continue. I also told her that, perhaps, the tri-phasic pills are making her feel different vs the monophasic pills. I told her I'd recommend sending in Sprintec to another pharmacy but she'd like to continue on the triphasic for now; I also offered TSH testing and patient to  consider.   I also asked her and her partner about their reproductive plans in the future and they do not want any more children. I d/w them re: vasectomy, BTL as well as other birth control options. Patient to consider and let me know if she's interested    RTC PRN  Cornelia Copa MD Attending Center for [facility])

## 2023-03-08 ENCOUNTER — Other Ambulatory Visit: Payer: Self-pay | Admitting: *Deleted

## 2023-03-08 MED ORDER — NORGESTIM-ETH ESTRAD TRIPHASIC 0.18/0.215/0.25 MG-25 MCG PO TABS: 1.0000 | ORAL_TABLET | Freq: Every day | ORAL | 11 refills | Status: DC

## 2023-03-10 ENCOUNTER — Ambulatory Visit
Admission: EM | Admit: 2023-03-10 | Discharge: 2023-03-10 | Disposition: A | Discharge status: Home / Self Care | Payer: Medicaid Other | Attending: Emergency Medicine | Admitting: Emergency Medicine

## 2023-03-10 ENCOUNTER — Emergency Department
Admission: EM | Admit: 2023-03-10 | Discharge: 2023-03-10 | Discharge status: Against Medical Advice | Payer: Medicaid Other

## 2023-03-10 DIAGNOSIS — A084 Viral intestinal infection, unspecified: Secondary | ICD-10-CM

## 2023-03-10 MED ORDER — ONDANSETRON HCL 4 MG/2ML IJ SOLN
4.0000 mg | Freq: Once | INTRAMUSCULAR | Status: AC
  Administered 2023-03-10: 4 mg via INTRAMUSCULAR

## 2023-03-10 MED ORDER — METOCLOPRAMIDE HCL 10 MG PO TABS: 10.0000 mg | ORAL_TABLET | Freq: Four times a day (QID) | ORAL | 0 refills | Status: DC

## 2023-03-10 MED ORDER — METOCLOPRAMIDE HCL 5 MG/ML IJ SOLN
10.0000 mg | Freq: Once | INTRAMUSCULAR | Status: AC
  Administered 2023-03-10: 10 mg via INTRAMUSCULAR

## 2023-03-10 NOTE — ED Triage Notes (Signed)
Patient to Urgent Care with complaints of  vomiting/ temps 99-100.5)/ chills/ headaches/ diarrhea.   Children have been sick w/ same symptoms (negative for flu and strep).

## 2023-03-10 NOTE — ED Provider Notes (Signed)
Maria Pugh    CSN: [id number] Arrival date & time: 03/10/23  1810      History   Chief Complaint Chief Complaint  Patient presents with   Emesis    HPI Maria Pugh is a 38 y.o. female.   Patient presents for evaluation of fever peaking at 101, nausea, vomiting, diarrhea present for 2 days.  endorses is shaking to the hands.  Known sick contacts in household.  Last occurrence of vomiting and diarrhea prior to visit.  Unable to tolerate food and liquids.  Attempted OTC Zofran but ineffective.  Denies congestion, ear pain, sore throat or cough.  Past Medical History:  Diagnosis Date   Abnormal uterine bleeding (AUB) 11/15/2016   Anxiety    Depression    Discharge from left nipple 04/11/2019   Galactorrhea    Gestational hypertension    History of abnormal cervical Pap smear 08/09/2014   LEEP. Follow-up Paps normal.   History of gestational hypertension 06/25/2021   Pancreatitis    Shingles 2013 and 2014   Vaginal Pap smear, abnormal     Patient Active Problem List   Diagnosis Date Noted   Anxiety and depression 01/31/2017    Past Surgical History:  Procedure Laterality Date   LEEP     MOUTH SURGERY  07/2014    OB History     Gravida  4   Para  3   Term  3   Preterm  0   AB  1   Living  3      SAB  1   IAB  0   Ectopic  0   Multiple  0   Live Births  3        Obstetric Comments  1st Menstrual Cycle:  12  1st Pregnancy:  25           Home Medications    Prior to Admission medications   Medication Sig Start Date End Date Taking? Authorizing Provider  metoCLOPramide (REGLAN) 10 MG tablet Take 1 tablet (10 mg total) by mouth every 6 (six) hours. 03/10/23  Yes Jimi Giza, Elita Boone, NP  Norgestim-Eth Estrad Triphasic (NORGESTIMATE-ETHINYL ESTRADIOL TRIPHASIC) 0.18/0.215/0.25 MG-25 MCG tab Take 1 tablet by mouth daily. 03/08/23   Chilton Bing, MD    Family History Family History  Problem Relation Age of Onset   Mental  illness Mother    Thyroid disease Mother    Graves' disease Mother    Bipolar disorder Mother    Heart disease Father    Hypertension Father    Stroke Father    Clotting disorder Father    Heart attack Father    Birth defects Brother    Autism Brother    Hashimoto's thyroiditis Brother    Developmental delay Daughter    Cancer Maternal Grandmother    Cancer Maternal Grandfather    Lung cancer Maternal Grandfather    Cancer Paternal Grandmother    Diabetes Paternal Grandmother    Pancreatic cancer Paternal Grandmother    Cancer Paternal Grandfather    Lung disease Paternal Grandfather    Kidney disease Paternal Grandfather    Acromegaly Cousin    Breast cancer Neg Hx    Asthma Neg Hx     Social History Social History   Tobacco Use   Smoking status: Never   Smokeless tobacco: Never  Vaping Use   Vaping status: Never Used  Substance Use Topics   Alcohol use: No   Drug use: No  Allergies   Patient has no known allergies.   Review of Systems Review of Systems   Physical Exam Triage Vital Signs ED Triage Vitals  Encounter Vitals Group     BP 03/10/23 1816 109/73     Systolic BP Percentile --      Diastolic BP Percentile --      Pulse Rate 03/10/23 1816 93     Resp 03/10/23 1816 18     Temp 03/10/23 1816 97.8 F (36.6 C)     Temp src --      SpO2 03/10/23 1816 98 %     Weight --      Height --      Head Circumference --      Peak Flow --      Pain Score 03/10/23 1815 5     Pain Loc --      Pain Education --      Exclude from Growth Chart --    No data found.  Updated Vital Signs BP 109/73   Pulse 93   Temp 97.8 F (36.6 C)   Resp 18   SpO2 98%   Visual Acuity Right Eye Distance:   Left Eye Distance:   Bilateral Distance:    Right Eye Near:   Left Eye Near:    Bilateral Near:     Physical Exam Constitutional:      Appearance: She is ill-appearing.  HENT:     Head: Normocephalic.  Eyes:     Extraocular Movements: Extraocular  movements intact.  Cardiovascular:     Rate and Rhythm: Normal rate and regular rhythm.     Pulses: Normal pulses.     Heart sounds: Normal heart sounds.  Pulmonary:     Effort: Pulmonary effort is normal.     Breath sounds: Normal breath sounds.  Abdominal:     General: Abdomen is flat. Bowel sounds are normal.     Palpations: Abdomen is soft.     Tenderness: There is abdominal tenderness in the right lower quadrant and left lower quadrant.  Musculoskeletal:     Comments: Tremor to the right hand  Neurological:     Mental Status: She is alert and oriented to person, place, and time. Mental status is at baseline.      UC Treatments / Results  Labs (all labs ordered are listed, but only abnormal results are displayed) Labs Reviewed - No data to display  EKG   Radiology No results found.  Procedures Procedures (including critical care time)  Medications Ordered in UC Medications  ondansetron (ZOFRAN) injection 4 mg (4 mg Intramuscular Given 03/10/23 1829)  metoCLOPramide (REGLAN) injection 10 mg (10 mg Intramuscular Given 03/10/23 1849)    Initial Impression / Assessment and Plan / UC Course  I have reviewed the triage vital signs and the nursing notes.  Pertinent labs & imaging results that were available during my care of the patient were reviewed by me and considered in my medical decision making (see chart for details).  Viral gastroenteritis  Vital signs stable, patient ill-appearing, no signs of distress nontoxic-appearing, Zofran IM given, on reevaluation after 10 to 15 minutes vomiting, Reglan 10 mg IM given on reevaluation after 10 to 15 minutes vomiting but endorses has lessened, clinic unable to give IV fluids, discussed this with patient prior to visit beginning, does not want to go to the emergency department as her infant is not able to stay with her due to current visitation policy as we are  not cold and flu season therefore Reglan prescribed for home use  recommended increase fluid intake with advancing diet as tolerated, given strict ER precautions that if vomiting continues to persist despite use of medicine she is to go to the nearest emergency department for reevaluation, verbalized understanding Final Clinical Impressions(s) / UC Diagnoses   Final diagnoses:  Viral gastroenteritis     Discharge Instructions      Your symptoms are most likely caused by a virus, it will work its way out your system over the next few days  You can use Reglan every 6 hours as needed for nausea, be mindful this medication may make you drowsy, take the first dose at home to see how it affects your body  You can use over-the-counter ibuprofen or Tylenol, which ever you have at home, to help manage fevers  Continue to promote hydration throughout the day by using electrolyte replacement solution such as Gatorade, body armor, Pedialyte, which ever you have at home  Try eating bland foods such as bread, rice, toast, fruit which are easier on the stomach to digest, avoid foods that are overly spicy, overly seasoned or greasy   If you continue to vomit and are unable to keep food and liquids down that you need to go back to the nearest emergency department for evaluation for IV medicines and fluids   ED Prescriptions     Medication Sig Dispense Auth. Provider   metoCLOPramide (REGLAN) 10 MG tablet Take 1 tablet (10 mg total) by mouth every 6 (six) hours. 20 tablet Valinda Hoar, NP      PDMP not reviewed this encounter.   Valinda Hoar, [place] 03/10/23 [phone]

## 2023-03-10 NOTE — ED Notes (Signed)
Patient called x3 for triage. No answer from the lobby.

## 2023-03-10 NOTE — Discharge Instructions (Signed)
Your symptoms are most likely caused by a virus, it will work its way out your system over the next few days  You can use Reglan every 6 hours as needed for nausea, be mindful this medication may make you drowsy, take the first dose at home to see how it affects your body  You can use over-the-counter ibuprofen or Tylenol, which ever you have at home, to help manage fevers  Continue to promote hydration throughout the day by using electrolyte replacement solution such as Gatorade, body armor, Pedialyte, which ever you have at home  Try eating bland foods such as bread, rice, toast, fruit which are easier on the stomach to digest, avoid foods that are overly spicy, overly seasoned or greasy   If you continue to vomit and are unable to keep food and liquids down that you need to go back to the nearest emergency department for evaluation for IV medicines and fluids

## 2023-04-25 DIAGNOSIS — M25511 Pain in right shoulder: Secondary | ICD-10-CM | POA: Diagnosis not present

## 2023-04-25 DIAGNOSIS — M25521 Pain in right elbow: Secondary | ICD-10-CM | POA: Diagnosis not present

## 2023-05-02 DIAGNOSIS — M25511 Pain in right shoulder: Secondary | ICD-10-CM | POA: Diagnosis not present

## 2023-05-06 DIAGNOSIS — M25511 Pain in right shoulder: Secondary | ICD-10-CM | POA: Diagnosis not present

## 2023-05-17 ENCOUNTER — Telehealth: Payer: Self-pay

## 2023-05-17 NOTE — Telephone Encounter (Signed)
 Pt calling stating that she is having more mood swings, night sweats, hard time sleeping.  Pt is asking for recommendation for possible menopause.    placed patient on the schedule to see provider   Does not want anti-depressants.    Pt is asking for me to send her most recent visits post last delivery to [facility] for the menopause specialist  that she has been researching.

## 2023-05-17 NOTE — Telephone Encounter (Signed)
 Pt has an appt.

## 2023-05-17 NOTE — Telephone Encounter (Signed)
 Copied from CRM [id number]. Topic: General - Other >> May 17, 2023 11:58 AM Truddie Crumble wrote: Reason for CRM: patient called stating the provider put in a referral for a growth on her scalp and now she has another growth on her scalp. Patient stated she never received the referral for the  first growth and never heard anything else about the referral. Patient also stated while she was pregnant her skin darken on her face  CB [phone]

## 2023-05-19 ENCOUNTER — Ambulatory Visit: Admitting: Family Medicine

## 2023-05-19 ENCOUNTER — Encounter: Payer: Self-pay | Admitting: Family Medicine

## 2023-05-19 VITALS — BP 116/74 | HR 93 | Temp 97.9°F | Resp 16 | Ht 60.0 in | Wt 119.5 lb

## 2023-05-19 DIAGNOSIS — L811 Chloasma: Secondary | ICD-10-CM

## 2023-05-19 DIAGNOSIS — L989 Disorder of the skin and subcutaneous tissue, unspecified: Secondary | ICD-10-CM

## 2023-05-19 MED ORDER — AZELAIC ACID 15 % EX GEL: CUTANEOUS | 1 refills | Status: DC

## 2023-05-19 NOTE — Progress Notes (Signed)
 Patient ID: EDLA PARA, female    DOB: [birth year], 38 y.o.   MRN: [id number]  PCP: Danelle Berry, PA-C  Chief Complaint  Patient presents with   Nevus    Concerning moles w/ fx hx of skin cancer   Mass    2 spots on scalp bleeds sometimes when brushes hair    Subjective:   Maria Pugh is a 38 y.o. female, presents to clinic with CC of the following:  HPI  Here for referrals Concerned about lesions on scalp - onset a few years ago but she never got into dermatology (past OV and referral done in 2021) She has a second lesion and they seem to have gotten larger Dark pigmentation on face since her pregnancy (2 years ago) Tried OTC skin spot/lightening creams and OTC retinols w/o improvement, gets worse with any sun exposure Also a small red bump on nose she is concerned about all of them due to parents having hx of skin CA  Patient Active Problem List   Diagnosis Date Noted   Anxiety and depression 01/31/2017      Current Outpatient Medications:    Norgestim-Eth Estrad Triphasic (NORGESTIMATE-ETHINYL ESTRADIOL TRIPHASIC) 0.18/0.215/0.25 MG-25 MCG tab, Take 1 tablet by mouth daily., Disp: 28 tablet, Rfl: 11   No Known Allergies   Social History   Tobacco Use   Smoking status: Never   Smokeless tobacco: Never  Vaping Use   Vaping status: Never Used  Substance Use Topics   Alcohol use: No   Drug use: No      Chart Review Today: I personally reviewed active problem list, medication list, allergies, family history, social history, health maintenance, notes from last encounter, lab results, imaging with the patient/caregiver today.   Review of Systems  Constitutional: Negative.   HENT: Negative.    Eyes: Negative.   Respiratory: Negative.    Cardiovascular: Negative.   Gastrointestinal: Negative.   Endocrine: Negative.   Genitourinary: Negative.   Musculoskeletal: Negative.   Skin: Negative.   Allergic/Immunologic: Negative.   Neurological:  Negative.   Hematological: Negative.   Psychiatric/Behavioral: Negative.    All other systems reviewed and are negative.      Objective:   Vitals:   05/19/23 1118  BP: 116/74  Pulse: 93  Resp: 16  Temp: 97.9 F (36.6 C)  SpO2: 97%  Weight: 119 lb 8 oz (54.2 kg)  Height: 5' (1.524 m)    Body mass index is 23.34 kg/m.  Physical Exam Vitals and nursing note reviewed.  Constitutional:      Appearance: She is well-developed.  HENT:     Head: Normocephalic and atraumatic.     Nose: Nose normal.  Eyes:     General:        Right eye: No discharge.        Left eye: No discharge.     Conjunctiva/sclera: Conjunctivae normal.  Neck:     Trachea: No tracheal deviation.  Cardiovascular:     Rate and Rhythm: Normal rate and regular rhythm.  Pulmonary:     Effort: Pulmonary effort is normal. No respiratory distress.     Breath sounds: No stridor.  Musculoskeletal:        General: Normal range of motion.  Skin:    General: Skin is warm and dry.     Findings: Lesion present. No rash.     Comments: Hyperpigmentation to areas of forehead/face - see photo 2 scalp lesions, soft, fleshy colored, flat top -  see photos both less than 1 cm in diameter, no tenderness   Neurological:     Mental Status: She is alert.     Motor: No abnormal muscle tone.     Coordination: Coordination normal.                 Assessment & Plan:   1. Scalp lesion (Primary) X 2 - similar morphology Refer to derm for excision - Ambulatory referral to Dermatology  2. Melasma Suspect hyperpigmentation is melasma, advised her to use sun block and avoid skin exposure to sunlight which will cause it to get darker Failed OTC meds x 1 year  Could start with first line topical meds, per insurance coverage azelaic acid gel - may be only rx covered option and she can f/up with dermatology - Azelaic Acid 15 % gel; After skin is thoroughly washed and patted dry, gently but thoroughly massage a thin film  of azelaic acid cream into the affected area twice daily, in the morning and evening.  Reduce frequency with skin irritation  Dispense: 50 g; Refill: 1 - Ambulatory referral to Dermatology      Danelle Berry, PA-C 05/19/23 11:23 AM

## 2023-05-19 NOTE — Patient Instructions (Signed)
 I will do a referral to dermatology once I chat with the referral coordinator about who is taking new patients  I will try to send in a prescription for something for the darkened skin For now make sure to be very vigilent with applying sun screen and avoiding time in the sun

## 2023-06-13 ENCOUNTER — Telehealth: Payer: Self-pay | Admitting: Family Medicine

## 2023-06-13 ENCOUNTER — Telehealth: Payer: Self-pay

## 2023-06-13 NOTE — Telephone Encounter (Unsigned)
 Copied from CRM [id number]. Topic: Referral - Status >> Jun 13, 2023 12:09 PM [name] wrote: Reason for CRM: pt called in to see if she can get a status on her dermatology referral . Pt has been waiting about 4 years and it keeps getting denied and getting worried . If there is any way someone can give her a call about this referral issues,. And also to see if she can go somewhere self pay without a referral to get this done .

## 2023-06-13 NOTE — Telephone Encounter (Unsigned)
 Copied from CRM [id number]. Topic: Referral - Status >> Jun 13, 2023 12:09 PM [name] wrote: Reason for CRM: pt called in to see if she can get a status on her dermatology referral . Pt has been waiting about 4 years and it keeps getting denied and getting worried . If there is any way someone can give her a call about this referral issues,. And also to see if she can go somewhere self pay without a referral to get this done . >> Jun 13, 2023  3:57 PM Lizabeth Riggs wrote: [facility] is out of network with her insurance. Please send this referral to [facility] and her insurance does need prior authorization. Please call Maria Pugh back and let her know the referral was sent and who will get the prior authorization from her insurance. Maria Pugh's number is [phone]. Thanks >> Jun 13, 2023  3:30 PM Juluis Ok wrote: Patient requesting a callback in regard to dermatology referral. She states that it has been 4 weeks and has not heard anything regarding the referral. Patient request a callback at [phone]

## 2023-06-13 NOTE — Telephone Encounter (Signed)
 Copied from CRM [id number]. Topic: Referral - Status >> Jun 13, 2023 12:09 PM [name] wrote: Reason for CRM: pt called in to see if she can get a status on her dermatology referral . Pt has been waiting about 4 years and it keeps getting denied and getting worried . If there is any way someone can give her a call about this referral issues,. And also to see if she can go somewhere self pay without a referral to get this done .

## 2023-06-13 NOTE — Telephone Encounter (Signed)
 [facility] Needs pre auth ins does not cover [place] but will cover [facility] please get this set up for her     Copied from CRM [id number]. Topic: Referral - Status >> Jun 13, 2023 12:09 PM [name] wrote: Reason for CRM: pt called in to see if she can get a status on her dermatology referral . Pt has been waiting about 4 years and it keeps getting denied and getting worried . If there is any way someone can give her a call about this referral issues,. And also to see if she can go somewhere self pay without a referral to get this done . >> Jun 13, 2023  3:57 PM Lizabeth Riggs wrote: [facility] is out of network with her insurance. Please send this referral to [facility] and her insurance does need prior authorization. Please call Maria Pugh back and let her know the referral was sent and who will get the prior authorization from her insurance. Maria Pugh's number is [phone]. Thanks >> Jun 13, 2023  3:30 PM Juluis Ok wrote: Patient requesting a callback in regard to dermatology referral. She states that it has been 4 weeks and has not heard anything regarding the referral. Patient request a callback at [phone]

## 2023-06-14 NOTE — Telephone Encounter (Signed)
 Referral has been sent to:   [facility]   P: [phone] F: [phone]   Release ID # [id number]

## 2023-07-21 ENCOUNTER — Ambulatory Visit: Admitting: Family Medicine

## 2023-07-21 ENCOUNTER — Encounter: Payer: Self-pay | Admitting: Family Medicine

## 2023-07-21 VITALS — BP 127/78 | HR 105 | Wt 122.8 lb

## 2023-07-21 DIAGNOSIS — N939 Abnormal uterine and vaginal bleeding, unspecified: Secondary | ICD-10-CM | POA: Diagnosis not present

## 2023-07-21 DIAGNOSIS — Z131 Encounter for screening for diabetes mellitus: Secondary | ICD-10-CM | POA: Diagnosis not present

## 2023-07-21 DIAGNOSIS — R232 Flushing: Secondary | ICD-10-CM | POA: Diagnosis not present

## 2023-07-21 NOTE — Progress Notes (Signed)
   Subjective:    Patient ID: Maria Pugh is a 38 y.o. female presenting with Follow-up  on 07/21/2023  HPI: Reports some symptoms that have been going on for some time. Has hot flashes and feels sweating and notes night sweats. Wakes up soaking wet. Not sleeping well. Feels panicky when she wakes up. Has difficulty falling back to sleep. Had not had a period until Feb. And now cycles are heavier. She is having migraines and N/V prior to her cycle. Goes away after her cycle comes. Has melasma.  Review of Systems  Constitutional:  Negative for chills and fever.  Respiratory:  Negative for shortness of breath.   Cardiovascular:  Negative for chest pain.  Gastrointestinal:  Negative for abdominal pain, nausea and vomiting.  Genitourinary:  Negative for dysuria.  Skin:  Negative for rash.      Objective:    BP 127/78   Pulse (!) 105   Wt 122 lb 12.8 oz (55.7 kg)   BMI 23.98 kg/m  Physical Exam Exam conducted with a chaperone present.  Constitutional:      General: She is not in acute distress.    Appearance: She is well-developed.  HENT:     Head: Normocephalic and atraumatic.  Eyes:     General: No scleral icterus. Cardiovascular:     Rate and Rhythm: Normal rate.  Pulmonary:     Effort: Pulmonary effort is normal.  Abdominal:     Palpations: Abdomen is soft.  Musculoskeletal:     Cervical back: Neck supple.  Skin:    General: Skin is warm and dry.  Neurological:     Mental Status: She is alert and oriented to person, place, and time.         Assessment & Plan:   Problem List Items Addressed This Visit       Unprioritized   Abnormal vaginal bleeding   If labs fail to find an issue, consider pelvic sonogram.      Hot flashes - Primary   Unclear etiology though treatment for menopause would be COC's, and so the fact that she is on COC's would make testing inaccurate and should mask any premature menopause symptoms. Will check labs and 24 hour urine to r/o  pheo and check her TFT's and vitamin D and electrolytes and CBC and A1C. Treatment based on findings.  To truly rule out premature menopause would need to stop her COC's x 3 months. She is not interested in pregnancy and is hesitant to come off these.       Relevant Orders   TSH   Catecholamines, fractionated, Urine, 24 hour   CBC   Comprehensive metabolic panel with GFR   Hemoglobin A1c   T3, free   T4, free   VITAMIN D 25 Hydroxy (Vit-D Deficiency, Fractures)   Follicle stimulating hormone    Return if symptoms worsen or fail to improve.  Granville Layer, MD 07/21/2023 8:28 AM

## 2023-07-21 NOTE — Assessment & Plan Note (Signed)
 If labs fail to find an issue, consider pelvic sonogram.

## 2023-07-21 NOTE — Assessment & Plan Note (Signed)
 Unclear etiology though treatment for menopause would be COC's, and so the fact that she is on COC's would make testing inaccurate and should mask any premature menopause symptoms. Will check labs and 24 hour urine to r/o pheo and check her TFT's and vitamin D and electrolytes and CBC and A1C. Treatment based on findings.  To truly rule out premature menopause would need to stop her COC's x 3 months. She is not interested in pregnancy and is hesitant to come off these.

## 2023-07-21 NOTE — Progress Notes (Signed)
 CC: discuss perimenopause   Night sweats, mood swings   OCP

## 2023-07-23 ENCOUNTER — Ambulatory Visit: Payer: Self-pay | Admitting: Family Medicine

## 2023-07-23 LAB — COMPREHENSIVE METABOLIC PANEL WITH GFR
ALT: 13 IU/L (ref 0–32)
AST: 20 IU/L (ref 0–40)
Albumin: 4.7 g/dL (ref 3.9–4.9)
Alkaline Phosphatase: 58 IU/L (ref 44–121)
BUN/Creatinine Ratio: 15 (ref 9–23)
BUN: 12 mg/dL (ref 6–20)
Bilirubin Total: 0.3 mg/dL (ref 0.0–1.2)
CO2: 17 mmol/L — ABNORMAL LOW (ref 20–29)
Calcium: 9.2 mg/dL (ref 8.7–10.2)
Chloride: 101 mmol/L (ref 96–106)
Creatinine, Ser: 0.79 mg/dL (ref 0.57–1.00)
Globulin, Total: 2.3 g/dL (ref 1.5–4.5)
Glucose: 95 mg/dL (ref 70–99)
Potassium: 4 mmol/L (ref 3.5–5.2)
Sodium: 137 mmol/L (ref 134–144)
Total Protein: 7 g/dL (ref 6.0–8.5)
eGFR: 99 mL/min/{1.73_m2} (ref >59)

## 2023-07-23 LAB — CBC
Hematocrit: 43.8 % (ref 34.0–46.6)
Hemoglobin: 14.7 g/dL (ref 11.1–15.9)
MCH: 33.1 pg — ABNORMAL HIGH (ref 26.6–33.0)
MCHC: 33.6 g/dL (ref 31.5–35.7)
MCV: 99 fL — ABNORMAL HIGH (ref 79–97)
Platelets: 328 10*3/uL (ref 150–450)
RBC: 4.44 x10E6/uL (ref 3.77–5.28)
RDW: 11.2 % — ABNORMAL LOW (ref 11.7–15.4)
WBC: 7.2 10*3/uL (ref 3.4–10.8)

## 2023-07-23 LAB — T4, FREE: Free T4: 1.17 ng/dL (ref 0.82–1.77)

## 2023-07-23 LAB — VITAMIN D 25 HYDROXY (VIT D DEFICIENCY, FRACTURES): Vit D, 25-Hydroxy: 40.6 ng/mL (ref 30.0–100.0)

## 2023-07-23 LAB — HEMOGLOBIN A1C
Est. average glucose Bld gHb Est-mCnc: 103 mg/dL
Hgb A1c MFr Bld: 5.2 % (ref 4.8–5.6)

## 2023-07-23 LAB — TSH: TSH: 1.18 u[IU]/mL (ref 0.450–4.500)

## 2023-07-23 LAB — FOLLICLE STIMULATING HORMONE: FSH: 4.8 m[IU]/mL

## 2023-07-23 LAB — T3, FREE: T3, Free: 3.5 pg/mL (ref 2.0–4.4)

## 2023-07-25 DIAGNOSIS — R232 Flushing: Secondary | ICD-10-CM | POA: Diagnosis not present

## 2023-07-30 LAB — CATECHOLAMINES, FRACTIONATED, URINE, 24 HOUR
Dopamine , 24H Ur: 305 ug/(24.h) (ref 0–510)
Dopamine, Rand Ur: 435 ug/L
Epinephrine, 24H Ur: 10 ug/(24.h) (ref 0–20)
Epinephrine, Rand Ur: 14 ug/L
Norepinephrine, 24H Ur: 40 ug/(24.h) (ref 0–135)
Norepinephrine, Rand Ur: 57 ug/L

## 2023-09-14 DIAGNOSIS — R208 Other disturbances of skin sensation: Secondary | ICD-10-CM | POA: Diagnosis not present

## 2023-09-14 DIAGNOSIS — L811 Chloasma: Secondary | ICD-10-CM | POA: Diagnosis not present

## 2023-09-14 DIAGNOSIS — D224 Melanocytic nevi of scalp and neck: Secondary | ICD-10-CM | POA: Diagnosis not present

## 2023-09-14 DIAGNOSIS — D485 Neoplasm of uncertain behavior of skin: Secondary | ICD-10-CM | POA: Diagnosis not present

## 2023-10-27 ENCOUNTER — Telehealth: Payer: Self-pay | Admitting: Family Medicine

## 2023-10-27 NOTE — Telephone Encounter (Signed)
 I have explained all of this to her and she wants a CMA to call her.

## 2023-10-27 NOTE — Telephone Encounter (Signed)
 Pt would like a call back for advice. I told pt she would need an appt and she's frustrated and wants a call back.

## 2023-10-27 NOTE — Telephone Encounter (Signed)
 Needs appt

## 2023-11-08 ENCOUNTER — Ambulatory Visit: Admitting: Obstetrics and Gynecology

## 2023-11-08 VITALS — BP 129/86 | HR 85 | Ht 62.0 in | Wt 118.4 lb

## 2023-11-08 DIAGNOSIS — Z3041 Encounter for surveillance of contraceptive pills: Secondary | ICD-10-CM | POA: Diagnosis not present

## 2023-11-08 DIAGNOSIS — Z7689 Persons encountering health services in other specified circumstances: Secondary | ICD-10-CM

## 2023-11-08 MED ORDER — PHEXXI 1.8-1-0.4 % VA GEL: VAGINAL | 4 refills | Status: AC

## 2023-11-09 NOTE — Progress Notes (Signed)
 Obstetrics and Gynecology Visit Return Patient Evaluation  Appointment Date: 11/08/2023  Primary Care Provider: Elza  OBGYN Clinic: [facility]   Chief Complaint: contraception management  History of Present Illness:  Maria Pugh is a 38 y.o. with symptoms of hot flashes, facial rash, difficulty sleeping, anxiety  Patient seen by Dr. Fredirick recently and had negative FSH, headaches, and catecholamine testing. She was seen by Derm and rash diagnosed as melasma and she has had difficulties getting in for an appointment with her PCP  Review of Systems: as noted in the History of Present Illness.  Patient Active Problem List   Diagnosis Date Noted   Hot flashes 07/21/2023   Anxiety and depression 01/31/2017   Abnormal vaginal bleeding 11/15/2016   Medications:  Leeza C. Dory had no medications administered during this visit. Current Outpatient Medications  Medication Sig Dispense Refill   Norgestim-Eth Estrad Triphasic (NORGESTIMATE -ETHINYL ESTRADIOL  TRIPHASIC) 0.18/0.215/0.25 MG-25 MCG tab Take 1 tablet by mouth daily. 28 tablet 11   Azelaic Acid  15 % gel After skin is thoroughly washed and patted dry, gently but thoroughly massage a thin film of azelaic acid  cream into the affected area twice daily, in the morning and evening.  Reduce frequency with skin irritation (Patient not taking: Reported on 07/21/2023) 50 g 1   No current facility-administered medications for this visit.    Allergies: has no known allergies.  Physical Exam:  BP 129/86 (BP Location: Left Arm, Patient Position: Sitting, Cuff Size: Normal)   Pulse 85   Ht 5' 2 (1.575 m)   Wt 118 lb 6.4 oz (53.7 kg)   LMP 11/01/2023 (Exact Date)   Breastfeeding Unknown   BMI 21.66 kg/m  Body mass index is 21.66 kg/m. General appearance: Well nourished, well developed female in no acute distress.  Abdomen: diffusely non tender to palpation, non distended, and no masses,  hernias Neuro/Psych:  Normal mood and affect.    Assessment: patient stable  Plan:  1. Encounter to establish care (Primary) Referral sent I told her to please contact her PCP for an appointment for other concerns  2. Encounter for surveillance of contraceptive pills I told her I recommend stopping the birth control hormones. She states that she didn't really have problems until she had difficulty getting sprintec and had to switch to tri-sprintec but with everything going on I told her I recommend stopping her combined OCPs. If she still wants hormonal birth control, I told her I recommend progestin only pills. Her husband is wary of getting a vasectomy and she doesn't want an IUD b/c of h/o one being embedded. I also told her about other options and Phexxi  sent in  Bebe Furry, Mickey MD Attending [facility] [place])

## 2023-11-11 ENCOUNTER — Ambulatory Visit: Admitting: Internal Medicine

## 2023-11-11 ENCOUNTER — Encounter: Payer: Self-pay | Admitting: Internal Medicine

## 2023-11-11 VITALS — BP 120/72 | HR 90 | Resp 16 | Ht 62.0 in | Wt 118.0 lb

## 2023-11-11 DIAGNOSIS — G43E01 Chronic migraine with aura, not intractable, with status migrainosus: Secondary | ICD-10-CM | POA: Diagnosis not present

## 2023-11-11 DIAGNOSIS — R232 Flushing: Secondary | ICD-10-CM | POA: Diagnosis not present

## 2023-11-11 DIAGNOSIS — G4700 Insomnia, unspecified: Secondary | ICD-10-CM

## 2023-11-11 DIAGNOSIS — M2550 Pain in unspecified joint: Secondary | ICD-10-CM

## 2023-11-11 DIAGNOSIS — N926 Irregular menstruation, unspecified: Secondary | ICD-10-CM

## 2023-11-11 NOTE — Progress Notes (Signed)
 Established Patient Office Visit  Subjective   Patient ID: Maria Pugh, female    DOB: 1985-11-18  Age: 38 y.o. MRN: [id number]  Chief Complaint  Patient presents with   Consult    Hormone labs normal in June by GYN. Feels like sx are worsening.   Hot Flashes   Insomnia   Night Sweats    Insomnia Primary symptoms: malaise/fatigue.      Patient is here for hormonal symptoms. She is a patient of Leisa's and this is my first time meeting her.   Discussed the use of AI scribe software for clinical note transcription with the patient, who gave verbal consent to proceed.  History of Present Illness Maria Pugh is a 38 year old female who presents with worsening migraines and irregular periods.  She experiences irregular periods with prolonged bleeding and frequent cycles, including a period lasting five months followed by a 12-day gap before another period. Her migraines last four to five days, with unilateral pain, visual changes, and vomiting, accompanied by brain fog affecting daily life and memory.  She has a history of endometriosis and cervical cancer treatment at age 44. During pregnancy, she developed melasma, which persists. She experiences weight fluctuations from 127 to 114 pounds within a month and joint pain in her ankle, knee, wrist, and neck.  Her family history includes thyroid  issues, with her mother having her thyroid  ablated and her brother having high thyroid  hormone levels. Her thyroid  function tests were normal.    Patient Active Problem List   Diagnosis Date Noted   Hot flashes 07/21/2023   Anxiety and depression 01/31/2017   Abnormal vaginal bleeding 11/15/2016   Past Medical History:  Diagnosis Date   Abnormal uterine bleeding (AUB) 11/15/2016   Anxiety    Depression    Discharge from left nipple 04/11/2019   Galactorrhea    Gestational hypertension    History of abnormal cervical Pap smear 08/09/2014   LEEP. Follow-up Paps normal.    History of gestational hypertension 06/25/2021   Pancreatitis    Shingles 2013 and 2014   Vaginal Pap smear, abnormal    Past Surgical History:  Procedure Laterality Date   LEEP     MOUTH SURGERY  07/2014   Social History   Tobacco Use   Smoking status: Never   Smokeless tobacco: Never  Vaping Use   Vaping status: Never Used  Substance Use Topics   Alcohol use: No   Drug use: No   Social History   Socioeconomic History   Marital status: Divorced    Spouse name: Not on file   Number of children: 2   Years of education: 12   Highest education level: Bachelor's degree (e.g., BA, AB, BS)  Occupational History   Occupation: Curator  Tobacco Use   Smoking status: Never   Smokeless tobacco: Never  Vaping Use   Vaping status: Never Used  Substance and Sexual Activity   Alcohol use: No   Drug use: No   Sexual activity: Yes    Partners: Male  Other Topics Concern   Not on file  Social History Narrative   Not on file   Social Drivers of Health   Financial Resource Strain: Medium Risk (11/09/2023)   Overall Financial Resource Strain (CARDIA)    Difficulty of Paying Living Expenses: Somewhat hard  Food Insecurity: No Food Insecurity (11/09/2023)   Hunger Vital Sign    Worried About Running Out of Food in the Last Year: Never  true    Ran Out of Food in the Last Year: Never true  Transportation Needs: No Transportation Needs (11/09/2023)   PRAPARE - Administrator, Civil Service (Medical): No    Lack of Transportation (Non-Medical): No  Physical Activity: Sufficiently Active (11/09/2023)   Exercise Vital Sign    Days of Exercise per Week: 7 days    Minutes of Exercise per Session: 30 min  Stress: No Stress Concern Present (11/09/2023)   [employer] of Occupational Health - Occupational Stress Questionnaire    Feeling of Stress: Not at all  Social Connections: Unknown (11/09/2023)   Social Connection and Isolation Panel    Frequency of  Communication with Friends and Family: Once a week    Frequency of Social Gatherings with Friends and Family: Patient declined    Attends Religious Services: Patient declined    Database administrator or Organizations: No    Attends Engineer, structural: Not on file    Marital Status: Divorced  Catering manager Violence: Not on file   Family Status  Relation Name Status   Mother  Alive   Father  Alive   Brother 4 1/2 brother's Alive   Daughter  Alive   Son  Alive   MGM  Deceased       cancer   MGF  Deceased       lung cancer   PGM  Deceased       panecreatic cancer   PGF  Deceased   Cousin  (Not Specified)   Neg Hx  (Not Specified)  No partnership data on file   Family History  Problem Relation Age of Onset   Mental illness Mother    Thyroid  disease Mother    Graves' disease Mother    Bipolar disorder Mother    Heart disease Father    Hypertension Father    Stroke Father    Clotting disorder Father    Heart attack Father    Birth defects Brother    Autism Brother    Hashimoto's thyroiditis Brother    Developmental delay Daughter    Cancer Maternal Grandmother    Cancer Maternal Grandfather    Lung cancer Maternal Grandfather    Cancer Paternal Grandmother    Diabetes Paternal Grandmother    Pancreatic cancer Paternal Grandmother    Cancer Paternal Grandfather    Lung disease Paternal Grandfather    Kidney disease Paternal Grandfather    Acromegaly Cousin    Breast cancer Neg Hx    Asthma Neg Hx    No Known Allergies    Review of Systems  Constitutional:  Positive for malaise/fatigue.  Musculoskeletal:  Positive for joint pain.  Psychiatric/Behavioral:  The patient has insomnia.       Objective:     BP 120/72   Pulse 90   Resp 16   Ht 5' 2 (1.575 m)   Wt 118 lb (53.5 kg)   LMP 11/01/2023 (Exact Date)   SpO2 97%   BMI 21.58 kg/m  BP Readings from Last 3 Encounters:  11/11/23 120/72  11/08/23 129/86  07/21/23 127/78   Wt Readings  from Last 3 Encounters:  11/11/23 118 lb (53.5 kg)  11/08/23 118 lb 6.4 oz (53.7 kg)  07/21/23 122 lb 12.8 oz (55.7 kg)      Physical Exam Constitutional:      Appearance: Normal appearance.  HENT:     Head: Normocephalic and atraumatic.  Eyes:     Conjunctiva/sclera:  Conjunctivae normal.  Skin:    General: Skin is warm and dry.  Neurological:     General: No focal deficit present.     Mental Status: She is alert. Mental status is at baseline.  Psychiatric:        Mood and Affect: Mood normal.        Behavior: Behavior normal.      No results found for any visits on 11/11/23.  Last CBC Lab Results  Component Value Date   WBC 7.2 07/21/2023   HGB 14.7 07/21/2023   HCT 43.8 07/21/2023   MCV 99 (H) 07/21/2023   MCH 33.1 (H) 07/21/2023   RDW 11.2 (L) 07/21/2023   PLT 328 07/21/2023   Last metabolic panel Lab Results  Component Value Date   GLUCOSE 95 07/21/2023   NA 137 07/21/2023   K 4.0 07/21/2023   CL 101 07/21/2023   CO2 17 (L) 07/21/2023   BUN 12 07/21/2023   CREATININE 0.79 07/21/2023   EGFR 99 07/21/2023   CALCIUM 9.2 07/21/2023   PROT 7.0 07/21/2023   ALBUMIN 4.7 07/21/2023   LABGLOB 2.3 07/21/2023   AGRATIO 2.0 06/25/2021   BILITOT 0.3 07/21/2023   ALKPHOS 58 07/21/2023   AST 20 07/21/2023   ALT 13 07/21/2023   ANIONGAP 6 05/01/2022   Last lipids Lab Results  Component Value Date   CHOL 178 04/11/2012   HDL 53 04/11/2012   LDLCALC 111 (H) 04/11/2012   TRIG 69 04/11/2012   CHOLHDL 3.4 04/11/2012   Last hemoglobin A1c Lab Results  Component Value Date   HGBA1C 5.2 07/21/2023   Last thyroid  functions Lab Results  Component Value Date   TSH 1.180 07/21/2023   Last vitamin D  Lab Results  Component Value Date   VD25OH 40.6 07/21/2023   Last vitamin B12 and Folate No results found for: VITAMINB12, FOLATE    The ASCVD Risk score (Arnett DK, et al., 2019) failed to calculate for the following reasons:   The 2019 ASCVD risk  score is only valid for ages 11 to 53    Assessment & Plan:   Assessment & Plan Migraine associated with hormonal contraception and menstrual irregularity Chronic migraines with unilateral headaches, visual aura, and vomiting, likely exacerbated by estrogen-containing birth control. Menstrual irregularity with prolonged and unpredictable periods. Estrogen may impact migraines. - Switch to progesterone-only birth control to reduce migraine frequency and severity. - Consider Nexplanon or vaginal ring as alternative contraceptive methods. - Discuss migraine treatment options once estrogen is discontinued.  Hormonal symptoms possibly related to perimenopause or ovarian dysfunction Persistent symptoms including brain fog, weight fluctuations, and mood changes. Symptoms mimic perimenopause but cannot be confirmed due to hormonal contraception. Previous evaluations suggested perimenopause or hormonal dysfunction without definitive diagnosis. - Advise switch to progesterone-only birth control to assess hormonal status.  Insomnia Chronic insomnia possibly related to stress and hormonal fluctuations.  Flushing Intermittent flushing episodes, possibly related to hormonal changes.  Joint pain, unspecified Chronic joint pain in ankles, knees, wrists, and neck for over two years. Possible autoimmune etiology considered but not confirmed. - Order inflammatory markers and ANA to screen for autoimmune disease.  - Sed Rate (ESR) - C-reactive protein - Antinuclear Antib (ANA)   Return if symptoms worsen or fail to improve.    Sharyle Fischer, DO

## 2023-11-13 LAB — ANA: Anti Nuclear Antibody (ANA): NEGATIVE

## 2023-11-13 LAB — C-REACTIVE PROTEIN: CRP: <3 mg/L (ref <8.0)

## 2023-11-13 LAB — SEDIMENTATION RATE: Sed Rate: 6 mm/h (ref 0–20)

## 2023-11-14 ENCOUNTER — Ambulatory Visit: Payer: Self-pay | Admitting: Internal Medicine

## 2024-02-13 ENCOUNTER — Other Ambulatory Visit: Payer: Self-pay | Admitting: Obstetrics and Gynecology
